# Patient Record
Sex: Female | Born: 1985 | Race: White | Hispanic: No | Marital: Married | Smoking: Never smoker
Health system: Southern US, Community
[De-identification: ages and names within clinical notes are randomized; demographics above are authoritative.]

## PROBLEM LIST (undated history)

## (undated) DIAGNOSIS — F419 Anxiety disorder, unspecified: Secondary | ICD-10-CM

## (undated) DIAGNOSIS — I499 Cardiac arrhythmia, unspecified: Secondary | ICD-10-CM

## (undated) DIAGNOSIS — R519 Headache, unspecified: Secondary | ICD-10-CM

## (undated) DIAGNOSIS — Z8739 Personal history of other diseases of the musculoskeletal system and connective tissue: Secondary | ICD-10-CM

## (undated) DIAGNOSIS — N189 Chronic kidney disease, unspecified: Secondary | ICD-10-CM

## (undated) DIAGNOSIS — M4646 Discitis, unspecified, lumbar region: Secondary | ICD-10-CM

## (undated) DIAGNOSIS — F32A Depression, unspecified: Secondary | ICD-10-CM

## (undated) DIAGNOSIS — N200 Calculus of kidney: Secondary | ICD-10-CM

## (undated) DIAGNOSIS — G8929 Other chronic pain: Secondary | ICD-10-CM

## (undated) DIAGNOSIS — R002 Palpitations: Secondary | ICD-10-CM

## (undated) DIAGNOSIS — I493 Ventricular premature depolarization: Secondary | ICD-10-CM

## (undated) DIAGNOSIS — T8853XA Unintended awareness under general anesthesia during procedure, initial encounter: Secondary | ICD-10-CM

## (undated) DIAGNOSIS — M4200 Juvenile osteochondrosis of spine, site unspecified: Secondary | ICD-10-CM

## (undated) DIAGNOSIS — M1990 Unspecified osteoarthritis, unspecified site: Secondary | ICD-10-CM

## (undated) DIAGNOSIS — E669 Obesity, unspecified: Secondary | ICD-10-CM

## (undated) HISTORY — PX: EXPLORATORY LAPAROTOMY: SUR591

## (undated) HISTORY — PX: LAMINECTOMY: SHX219

## (undated) HISTORY — PX: OTHER SURGICAL HISTORY: SHX169

## (undated) HISTORY — PX: POSTERIOR LAMINECTOMY / DECOMPRESSION CERVICAL SPINE: SUR739

---
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
## 2012-09-28 HISTORY — PX: BARIATRIC SURGERY: SHX1103

## 2016-06-12 ENCOUNTER — Emergency Department
Admission: EM | Admit: 2016-06-12 | Discharge: 2016-06-13 | Disposition: A | Discharge status: Home / Self Care | Payer: Managed Care, Other (non HMO) | Attending: Emergency Medicine | Admitting: Emergency Medicine

## 2016-06-12 ENCOUNTER — Encounter: Payer: Self-pay | Admitting: Emergency Medicine

## 2016-06-12 DIAGNOSIS — R002 Palpitations: Secondary | ICD-10-CM | POA: Diagnosis not present

## 2016-06-12 HISTORY — DX: Ventricular premature depolarization: I49.3

## 2016-06-12 HISTORY — DX: Unspecified osteoarthritis, unspecified site: M19.90

## 2016-06-12 LAB — BASIC METABOLIC PANEL
ANION GAP: 6 (ref 5–15)
BUN: 12 mg/dL (ref 6–20)
CO2: 22 mmol/L (ref 22–32)
Calcium: 9.4 mg/dL (ref 8.9–10.3)
Chloride: 111 mmol/L (ref 101–111)
Creatinine, Ser: 0.81 mg/dL (ref 0.44–1.00)
Glucose, Bld: 96 mg/dL (ref 65–99)
POTASSIUM: 3.5 mmol/L (ref 3.5–5.1)
SODIUM: 139 mmol/L (ref 135–145)

## 2016-06-12 LAB — CBC
HEMATOCRIT: 38.6 % (ref 35.0–47.0)
HEMOGLOBIN: 13.1 g/dL (ref 12.0–16.0)
MCH: 28.8 pg (ref 26.0–34.0)
MCHC: 34 g/dL (ref 32.0–36.0)
MCV: 84.6 fL (ref 80.0–100.0)
Platelets: 247 10*3/uL (ref 150–440)
RBC: 4.56 MIL/uL (ref 3.80–5.20)
RDW: 12.9 % (ref 11.5–14.5)
WBC: 7 10*3/uL (ref 3.6–11.0)

## 2016-06-12 LAB — TROPONIN I: Troponin I: <0.03 ng/mL (ref <0.03)

## 2016-06-12 LAB — POCT PREGNANCY, URINE: PREG TEST UR: NEGATIVE

## 2016-06-12 MED ORDER — ONDANSETRON HCL 4 MG/2ML IJ SOLN
4.0000 mg | Freq: Once | INTRAMUSCULAR | Status: AC
  Administered 2016-06-12: 4 mg via INTRAVENOUS

## 2016-06-12 MED ORDER — ONDANSETRON HCL 4 MG/2ML IJ SOLN
INTRAMUSCULAR | Status: AC
  Filled 2016-06-12: qty 2

## 2016-06-12 NOTE — ED Notes (Signed)
Pt c/o palpitations/PVCs at approx 2030. Pt c/o chest pain described as tightness, without radiation, with accompanying symptoms of dizziness/nausea/lightheadedness/dizzness/SOB

## 2016-06-12 NOTE — ED Triage Notes (Signed)
Pt states "i feel like my heart is quivering." pt states symptoms started approx 1 hour pta, pt states "i haven't felt good all day." pt states she has felt nauseted, shob, "felt like I was drunk, lightheaded". Pt denies diaphoresis. Pt states " I couldn't find my words this afternoon.". Skin pale, warm and dry. Pt is shivering in triage, states 'i feel so cold."

## 2016-06-12 NOTE — ED Notes (Signed)
MD Goodman at bedside. 

## 2016-06-12 NOTE — ED Notes (Signed)
Reviewed d/c instructions and follow-up care with pt. Pt verbalized understanding 

## 2016-06-12 NOTE — ED Provider Notes (Signed)
[facility] Emergency Department Provider Note   ____________________________________________   I have reviewed the triage vital signs and the nursing notes.   HISTORY  Chief Complaint Palpitations   History limited by: Not Limited   HPI Denise Raymond is a 30 y.o. female who presents to the emergency department today because of concerns for palpitations and racing heart beat that occurred earlier today. She states she had a couple of episodes. Throughout the whole day she not been feeling right. She had felt somewhat intoxicated although has not drunk any alcohol. I have any excessive caffeine use and did not have a cup of coffee today. She has had problems with PVCs in the past and is on medication including a beta blocker for this. She did take this beta blocker prior to coming to the emergency department and since being here his not had any further sensation of racing heart. She denies any associated chest pain. No recent fevers or illness.   Past Medical History:  Diagnosis Date  . DJD (degenerative joint disease)   . PVC (premature ventricular contraction)     There are no active problems to display for this patient.   Past Surgical History:  Procedure Laterality Date  . lap band sugery      Prior to Admission medications   Not on File    Allergies Bentyl [dicyclomine hcl]; Nsaids; and Reglan [metoclopramide]  No family history on file.  Social History Social History  Substance Use Topics  . Smoking status: Never Smoker  . Smokeless tobacco: Never Used  . Alcohol use No    Review of Systems  Constitutional: Negative for fever. Cardiovascular: Negative for chest pain. Positive for palpitations. Respiratory: Negative for shortness of breath. Gastrointestinal: Negative for abdominal pain, vomiting and diarrhea. Genitourinary: Negative for dysuria. Musculoskeletal: Negative for back pain. Skin: Negative for rash. Neurological:  Negative for headaches, focal weakness or numbness.   10-point ROS otherwise negative.  ____________________________________________   PHYSICAL EXAM:  VITAL SIGNS: ED Triage Vitals  Enc Vitals Group     BP 06/12/16 2137 (!) 128/102     Pulse Rate 06/12/16 2137 75     Resp 06/12/16 2134 (!) 24     Temp 06/12/16 2137 97.8 F (36.6 C)     Temp Source 06/12/16 2137 Oral     SpO2 06/12/16 2137 100 %     Weight 06/12/16 2134 (!) 385 lb (174.6 kg)     Height 06/12/16 2134 5\' 10"  (1.778 m)     Head Circumference --      Peak Flow --      Pain Score 06/12/16 2134 3   Constitutional: Alert and oriented. Well appearing and in no distress. Eyes: Conjunctivae are normal. Normal extraocular movements. ENT   Head: Normocephalic and atraumatic.   Nose: No congestion/rhinnorhea.   Mouth/Throat: Mucous membranes are moist.   Neck: No stridor. Hematological/Lymphatic/Immunilogical: No cervical lymphadenopathy. Cardiovascular: Normal rate, regular rhythm.  No murmurs, rubs, or gallops. Respiratory: Normal respiratory effort without tachypnea nor retractions. Breath sounds are clear and equal bilaterally. No wheezes/rales/rhonchi. Gastrointestinal: Soft and nontender. No distention.  Genitourinary: Deferred Musculoskeletal: Normal range of motion in all extremities. No lower extremity edema. Neurologic:  Normal speech and language. No gross focal neurologic deficits are appreciated.  Skin:  Skin is warm, dry and intact. No rash noted. Psychiatric: Mood and affect are normal. Speech and behavior are normal. Patient exhibits appropriate insight and judgment.  ____________________________________________    LABS (pertinent positives/negatives)  Labs Reviewed  BASIC METABOLIC PANEL  CBC  TROPONIN I  POCT PREGNANCY, URINE     ____________________________________________   EKG  I, Phineas Semen, attending physician, personally viewed and interpreted this EKG  EKG  Time: 2135 Rate: 73 Rhythm: normal sinus rhythm with 1st degree av block Axis: normal Intervals: qtc 423, 1st degree av block QRS: narrow ST changes: no st elevation Impression: abnormal ekg   ____________________________________________    RADIOLOGY  None   ____________________________________________   PROCEDURES  Procedures  ____________________________________________   INITIAL IMPRESSION / ASSESSMENT AND PLAN / ED COURSE  Pertinent labs & imaging results that were available during my care of the patient were reviewed by me and considered in my medical decision making (see chart for details).  Patient presented to the emergency department today because of concerns for palpitations and racing heart. On exam patient appears well. No murmurs rubs or gallops appreciated. I did notice a occasional PVC on the monitor however no sustained runs. I did discuss with patient that blood work all looked good. Did encourage patient to follow up with primary care and potentially cardiology. Did recommend that that she discuss Holter monitor with her provider. ____________________________________________   FINAL CLINICAL IMPRESSION(S) / ED DIAGNOSES  Final diagnoses:  Heart palpitations     Note: This dictation was prepared with Dragon dictation. Any transcriptional errors that result from this process are unintentional    Phineas Semen, MD 06/13/16 0023

## 2016-06-12 NOTE — ED Notes (Signed)
Pt c/o nausea. MD Derrill KayGoodman informed. MD ordered IV zofran

## 2016-06-12 NOTE — Discharge Instructions (Signed)
Please seek medical attention for any high fevers, chest pain, shortness of breath, change in behavior, persistent vomiting, bloody stool or any other new or concerning symptoms.  

## 2016-11-04 ENCOUNTER — Emergency Department
Admission: EM | Admit: 2016-11-04 | Discharge: 2016-11-04 | Disposition: A | Discharge status: Home / Self Care | Payer: Managed Care, Other (non HMO) | Attending: Emergency Medicine | Admitting: Emergency Medicine

## 2016-11-04 ENCOUNTER — Encounter: Payer: Self-pay | Admitting: Emergency Medicine

## 2016-11-04 ENCOUNTER — Emergency Department: Payer: Managed Care, Other (non HMO)

## 2016-11-04 DIAGNOSIS — R109 Unspecified abdominal pain: Secondary | ICD-10-CM | POA: Insufficient documentation

## 2016-11-04 DIAGNOSIS — Z79899 Other long term (current) drug therapy: Secondary | ICD-10-CM | POA: Diagnosis not present

## 2016-11-04 DIAGNOSIS — R319 Hematuria, unspecified: Secondary | ICD-10-CM | POA: Insufficient documentation

## 2016-11-04 DIAGNOSIS — O26891 Other specified pregnancy related conditions, first trimester: Secondary | ICD-10-CM | POA: Diagnosis present

## 2016-11-04 DIAGNOSIS — Z3A12 12 weeks gestation of pregnancy: Secondary | ICD-10-CM | POA: Insufficient documentation

## 2016-11-04 LAB — CBC
HEMATOCRIT: 34.1 % — AB (ref 35.0–47.0)
HEMOGLOBIN: 12.2 g/dL (ref 12.0–16.0)
MCH: 29.8 pg (ref 26.0–34.0)
MCHC: 35.7 g/dL (ref 32.0–36.0)
MCV: 83.5 fL (ref 80.0–100.0)
Platelets: 247 10*3/uL (ref 150–440)
RBC: 4.08 MIL/uL (ref 3.80–5.20)
RDW: 13.2 % (ref 11.5–14.5)
WBC: 6.8 10*3/uL (ref 3.6–11.0)

## 2016-11-04 LAB — HEPATIC FUNCTION PANEL
ALK PHOS: 51 U/L (ref 38–126)
ALT: 12 U/L — ABNORMAL LOW (ref 14–54)
AST: 19 U/L (ref 15–41)
Albumin: 4.1 g/dL (ref 3.5–5.0)
BILIRUBIN TOTAL: 0.1 mg/dL — AB (ref 0.3–1.2)
Bilirubin, Direct: <0.1 mg/dL — ABNORMAL LOW (ref 0.1–0.5)
TOTAL PROTEIN: 7.6 g/dL (ref 6.5–8.1)

## 2016-11-04 LAB — URINALYSIS, COMPLETE (UACMP) WITH MICROSCOPIC
Bilirubin Urine: NEGATIVE
GLUCOSE, UA: NEGATIVE mg/dL
KETONES UR: NEGATIVE mg/dL
LEUKOCYTES UA: NEGATIVE
NITRITE: NEGATIVE
PH: 5 (ref 5.0–8.0)
Protein, ur: NEGATIVE mg/dL
Specific Gravity, Urine: 1.019 (ref 1.005–1.030)

## 2016-11-04 LAB — BASIC METABOLIC PANEL
ANION GAP: 8 (ref 5–15)
BUN: 10 mg/dL (ref 6–20)
CALCIUM: 9.5 mg/dL (ref 8.9–10.3)
CO2: 23 mmol/L (ref 22–32)
Chloride: 102 mmol/L (ref 101–111)
Creatinine, Ser: 0.6 mg/dL (ref 0.44–1.00)
GFR calc Af Amer: >60 mL/min (ref >60)
GFR calc non Af Amer: >60 mL/min (ref >60)
GLUCOSE: 93 mg/dL (ref 65–99)
Potassium: 3.5 mmol/L (ref 3.5–5.1)
Sodium: 133 mmol/L — ABNORMAL LOW (ref 135–145)

## 2016-11-04 MED ORDER — PROMETHAZINE HCL 25 MG/ML IJ SOLN
12.5000 mg | Freq: Once | INTRAMUSCULAR | Status: AC
  Administered 2016-11-04: 12.5 mg via INTRAVENOUS

## 2016-11-04 MED ORDER — PROMETHAZINE HCL 25 MG/ML IJ SOLN
INTRAMUSCULAR | Status: AC
  Administered 2016-11-04: 12.5 mg via INTRAVENOUS
  Filled 2016-11-04: qty 1

## 2016-11-04 MED ORDER — SODIUM CHLORIDE 0.9 % IV BOLUS (SEPSIS)
1000.0000 mL | Freq: Once | INTRAVENOUS | Status: AC
  Administered 2016-11-04: 1000 mL via INTRAVENOUS

## 2016-11-04 MED ORDER — OXYCODONE-ACETAMINOPHEN 5-325 MG PO TABS: 1.0000 | ORAL_TABLET | Freq: Four times a day (QID) | ORAL | 0 refills | Status: AC | PRN

## 2016-11-04 NOTE — ED Notes (Signed)
Dr. Malinda at bedside.  

## 2016-11-04 NOTE — ED Triage Notes (Addendum)
Pt presents to ED via POV. C/O sudden onset back pain that started on Sunday with vaginal cramping. Pt states [facility] and [facility] for possible miscarriage, denies any vaginal cramping/bleeding at this time. Pt states [facility] and sent home on Keflex. Pt presents today with c/o nausea, "feeling horrible", back pain. Pt states at this time back pain is 10/10 intermittent.

## 2016-11-04 NOTE — Discharge Instructions (Signed)
I think you're having a kidney stone. You have blood in your urine. I will give you a prescription for Percocet one pill 4 times a day as needed for the pain. Please return for worse pain fever vomiting feeling sicker or for any other complaints. Please follow-up with your OB/GYN doctor this week. Drink plenty of fluids

## 2016-11-04 NOTE — ED Provider Notes (Signed)
[facility] Emergency Department Provider Note   ____________________________________________   First MD Initiated Contact with Patient 11/04/16 [phone]     (approximate)  I have reviewed the triage vital signs and the nursing notes.   HISTORY  Chief Complaint Flank Pain and Back Pain    HPI Denise Raymond is a 31 y.o. female with her first pregnancy. She is 12 weeks tomorrow. She reports 4 days ago she had sudden onset of back pain and vaginal cramping. [facility] evaluated for possible miscarriage diagnosed with UTI. Patient comes back now complaining of feeling bad and having bilateral back pain. It's worse on the left. She is nauseated and has not been running a fever is not vomiting   Past Medical History:  Diagnosis Date  . DJD (degenerative joint disease)   . PVC (premature ventricular contraction)     There are no active problems to display for this patient.   Past Surgical History:  Procedure Laterality Date  . lap band sugery      Prior to Admission medications   Medication Sig Start Date End Date Taking? Authorizing Provider  Cholecalciferol (HM VITAMIN D3) 4000 units CAPS Take 4,000 Units by mouth daily.   Yes Historical Provider, MD  folic acid (FOLVITE) 400 MCG tablet Take 400 mcg by mouth daily.   Yes Historical Provider, MD  prenatal vitamin w/FE, FA (NATACHEW) 29-1 MG CHEW chewable tablet Chew 1 tablet by mouth daily at 12 noon.   Yes Historical Provider, MD  oxyCODONE-acetaminophen (ROXICET) 5-325 MG tablet Take 1 tablet by mouth every 6 (six) hours as needed. 11/04/16 11/04/17  Arnaldo NatalPaul F Malinda, MD    Allergies Bentyl [dicyclomine hcl]; Nsaids; and Reglan [metoclopramide]  History reviewed. No pertinent family history.  Social History Social History  Substance Use Topics  . Smoking status: Never Smoker  . Smokeless tobacco: Never Used  . Alcohol use No    Review of Systems Constitutional: No  fever/chills Eyes: No visual changes. ENT: No sore throat. Cardiovascular: Denies chest pain. Respiratory: Denies shortness of breath. Gastrointestinal: No abdominal pain.  No nausea, no vomiting.  No diarrhea.  No constipation. Genitourinary: Negative for dysuria. Musculoskeletal:  back pain. Skin: Negative for rash. Neurological: Negative for headaches, focal weakness or numbness.  10-point ROS otherwise negative.  ____________________________________________   PHYSICAL EXAM:  VITAL SIGNS: ED Triage Vitals  Enc Vitals Group     BP 11/04/16 0920 113/73     Pulse Rate 11/04/16 0920 75     Resp --      Temp 11/04/16 0920 97.8 F (36.6 C)     Temp Source 11/04/16 0920 Oral     SpO2 11/04/16 0920 100 %     Weight --      Height --      Head Circumference --      Peak Flow --      Pain Score 11/04/16 0904 6     Pain Loc --      Pain Edu? --      Excl. in GC? --     Constitutional: Alert and oriented. Well appearing and in no acute distress. Eyes: Conjunctivae are normal. PERRL. EOMI. Head: Atraumatic. Nose: No congestion/rhinnorhea. Mouth/Throat: Mucous membranes are moist.  Oropharynx non-erythematous. Neck: No stridor.  Cardiovascular: Normal rate, regular rhythm. Grossly normal heart sounds.  Good peripheral circulation. Respiratory: Normal respiratory effort.  No retractions. Lungs CTAB. Gastrointestinal: Soft and nontender. No distention. No abdominal  bruits. No CVA tenderness. Musculoskeletal: No lower extremity tenderness nor edema.  No joint effusions. Neurologic:  Normal speech and language. No gross focal neurologic deficits are appreciated. No gait instability. Skin:  Skin is warm, dry and intact. No rash noted. Psychiatric: Mood and affect are normal. Speech and behavior are normal.  ____________________________________________   LABS (all labs ordered are listed, but only abnormal results are displayed)  Labs Reviewed  URINALYSIS, COMPLETE (UACMP)  WITH MICROSCOPIC - Abnormal; Notable for the following:       Result Value   Color, Urine YELLOW (*)    APPearance CLEAR (*)    Hgb urine dipstick MODERATE (*)    Bacteria, UA RARE (*)    Squamous Epithelial / LPF 0-5 (*)    All other components within normal limits  BASIC METABOLIC PANEL - Abnormal; Notable for the following:    Sodium 133 (*)    All other components within normal limits  CBC - Abnormal; Notable for the following:    HCT 34.1 (*)    All other components within normal limits  HEPATIC FUNCTION PANEL - Abnormal; Notable for the following:    ALT 12 (*)    Total Bilirubin 0.1 (*)    Bilirubin, Direct <0.1 (*)    All other components within normal limits  URINE CULTURE   ____________________________________________  EKG   ____________________________________________  RADIOLOGY  Study Result   CLINICAL DATA:  Left-greater-than-right flank pain for 4 days. The patient is [redacted] weeks pregnant, current urinary tract infection  EXAM: RENAL / URINARY TRACT ULTRASOUND COMPLETE  COMPARISON:  None.  FINDINGS: Right Kidney:  Length: 13.6 cm.  No hydronephrosis is seen.  Left Kidney:  Length: 13.2 cm. No hydronephrosis is noted. The lower pole is not well seen due to bowel gas.  Bladder:  The urinary bladder is not well distended but no abnormality is noted.  IMPRESSION: No hydronephrosis.  No renal calculi are evident.   Electronically Signed   By: Dwyane Dee M.D.   On: 11/04/2016 10:16     ____________________________________________   PROCEDURES  Procedure(s) performed:   Procedures  Critical Care performed ____________________________________________   INITIAL IMPRESSION / ASSESSMENT AND PLAN / ED COURSE  Pertinent labs & imaging results that were available during my care of the patient were reviewed by me and considered in my medical decision making (see chart for details).         ____________________________________________   FINAL CLINICAL IMPRESSION(S) / ED DIAGNOSES  Final diagnoses:  Flank pain  Hematuria, unspecified type      NEW MEDICATIONS STARTED DURING THIS VISIT:  Discharge Medication List as of 11/04/2016 12:37 PM    START taking these medications   Details  oxyCODONE-acetaminophen (ROXICET) 5-325 MG tablet Take 1 tablet by mouth every 6 (six) hours as needed., Starting Wed 11/04/2016, Until Thu 11/04/2017, Print         Note:  This document was prepared using Dragon voice recognition software and may include unintentional dictation errors.    Arnaldo Natal, MD 11/04/16 [phone]

## 2016-11-05 LAB — URINE CULTURE

## 2018-02-23 ENCOUNTER — Emergency Department: Payer: 59

## 2018-02-23 ENCOUNTER — Emergency Department
Admission: EM | Admit: 2018-02-23 | Discharge: 2018-02-23 | Disposition: A | Discharge status: Home / Self Care | Payer: 59 | Attending: Emergency Medicine | Admitting: Emergency Medicine

## 2018-02-23 ENCOUNTER — Other Ambulatory Visit: Payer: Self-pay

## 2018-02-23 ENCOUNTER — Encounter: Payer: Self-pay | Admitting: Emergency Medicine

## 2018-02-23 DIAGNOSIS — R110 Nausea: Secondary | ICD-10-CM | POA: Insufficient documentation

## 2018-02-23 DIAGNOSIS — R109 Unspecified abdominal pain: Secondary | ICD-10-CM | POA: Diagnosis present

## 2018-02-23 DIAGNOSIS — Z9884 Bariatric surgery status: Secondary | ICD-10-CM | POA: Diagnosis not present

## 2018-02-23 DIAGNOSIS — Z79899 Other long term (current) drug therapy: Secondary | ICD-10-CM | POA: Insufficient documentation

## 2018-02-23 DIAGNOSIS — B379 Candidiasis, unspecified: Secondary | ICD-10-CM

## 2018-02-23 DIAGNOSIS — R1084 Generalized abdominal pain: Secondary | ICD-10-CM

## 2018-02-23 LAB — URINALYSIS, COMPLETE (UACMP) WITH MICROSCOPIC
BILIRUBIN URINE: NEGATIVE
Bacteria, UA: NONE SEEN
Glucose, UA: NEGATIVE mg/dL
Ketones, ur: NEGATIVE mg/dL
NITRITE: NEGATIVE
PROTEIN: NEGATIVE mg/dL
Specific Gravity, Urine: 1.024 (ref 1.005–1.030)
pH: 5 (ref 5.0–8.0)

## 2018-02-23 LAB — COMPREHENSIVE METABOLIC PANEL
ALK PHOS: 86 U/L (ref 38–126)
ALT: 12 U/L — ABNORMAL LOW (ref 14–54)
ANION GAP: 5 (ref 5–15)
AST: 17 U/L (ref 15–41)
Albumin: 3.6 g/dL (ref 3.5–5.0)
BILIRUBIN TOTAL: 0.4 mg/dL (ref 0.3–1.2)
BUN: 14 mg/dL (ref 6–20)
CALCIUM: 9 mg/dL (ref 8.9–10.3)
CO2: 27 mmol/L (ref 22–32)
Chloride: 103 mmol/L (ref 101–111)
Creatinine, Ser: 0.76 mg/dL (ref 0.44–1.00)
GFR calc non Af Amer: >60 mL/min (ref >60)
Glucose, Bld: 94 mg/dL (ref 65–99)
Potassium: 4.1 mmol/L (ref 3.5–5.1)
Sodium: 135 mmol/L (ref 135–145)
TOTAL PROTEIN: 7.8 g/dL (ref 6.5–8.1)

## 2018-02-23 LAB — CBC
HCT: 38.9 % (ref 35.0–47.0)
HEMOGLOBIN: 13 g/dL (ref 12.0–16.0)
MCH: 27.2 pg (ref 26.0–34.0)
MCHC: 33.4 g/dL (ref 32.0–36.0)
MCV: 81.6 fL (ref 80.0–100.0)
Platelets: 301 10*3/uL (ref 150–440)
RBC: 4.76 MIL/uL (ref 3.80–5.20)
RDW: 13.9 % (ref 11.5–14.5)
WBC: 6.3 10*3/uL (ref 3.6–11.0)

## 2018-02-23 LAB — POCT PREGNANCY, URINE: Preg Test, Ur: NEGATIVE

## 2018-02-23 LAB — LIPASE, BLOOD: Lipase: 25 U/L (ref 11–51)

## 2018-02-23 MED ORDER — SODIUM CHLORIDE 0.9 % IV BOLUS
1000.0000 mL | Freq: Once | INTRAVENOUS | Status: AC
  Administered 2018-02-23: 1000 mL via INTRAVENOUS

## 2018-02-23 MED ORDER — ONDANSETRON 4 MG PO TBDP: 4.0000 mg | ORAL_TABLET | Freq: Three times a day (TID) | ORAL | 0 refills | Status: DC | PRN

## 2018-02-23 MED ORDER — NYSTATIN 100000 UNIT/GM EX POWD: Freq: Four times a day (QID) | CUTANEOUS | 0 refills | Status: DC

## 2018-02-23 MED ORDER — ONDANSETRON HCL 4 MG/2ML IJ SOLN
4.0000 mg | Freq: Once | INTRAMUSCULAR | Status: AC
  Administered 2018-02-23: 4 mg via INTRAVENOUS
  Filled 2018-02-23: qty 2

## 2018-02-23 MED ORDER — IOPAMIDOL (ISOVUE-370) INJECTION 76%
100.0000 mL | Freq: Once | INTRAVENOUS | Status: AC | PRN
Start: 2018-02-23 — End: 2018-02-23
  Administered 2018-02-23: 100 mL via INTRAVENOUS

## 2018-02-23 NOTE — Discharge Instructions (Signed)
Your labs and CT scan of the abdomen were unremarkable today. Please follow up with your doctor for continued monitoring of your symptoms.

## 2018-02-23 NOTE — ED Notes (Addendum)
Pt arrived with c/o abdominal pain in the lower right umbilicus that radiates to the left since Monday. Pt states that she has had Nausea and took some zofran from pregnancy that helped ease nausea.

## 2018-02-23 NOTE — ED Triage Notes (Signed)
Pt c/o lower abdominal pain since Monday states the pain radiates down to the right groin.  Pt describes the pain as sharp 5/10.  Pt states the pain is worse with palpation.    Pt c/o nausea, pt states she cannot vomit due to having a gastric band.   Pt has had cholecystectomy.

## 2018-02-23 NOTE — ED Notes (Signed)
First Nurse Note:  Patient to ED via WC from [facility] with hx of peri-umbilical pain since Monday radiating to RLQ, patient does have her appendix.

## 2018-02-23 NOTE — ED Provider Notes (Addendum)
[facility] Emergency Department Provider Note  ____________________________________________  Time seen: Approximately 2:36 PM  I have reviewed the triage vital signs and the nursing notes.   HISTORY  Chief Complaint Abdominal Pain    HPI Denise Raymond is a 32 y.o. female with a history of bariatric lap band surgery and DJD who complains of generalized abdominal pain around the umbilicus stands yesterday. Constant, worse lying supine, no alleviating factors, nonradiating. Moderate intensity, aching. Associated nausea but no vomiting diarrhea or constipation. Last bowel movement was yesterday.      Past Medical History:  Diagnosis Date  . DJD (degenerative joint disease)   . PVC (premature ventricular contraction)      There are no active problems to display for this patient.    Past Surgical History:  Procedure Laterality Date  . lap band sugery       Prior to Admission medications   Medication Sig Start Date End Date Taking? Authorizing Provider  baclofen (LIORESAL) 10 MG tablet Take 10 mg by mouth daily. 02/17/18  Yes [provider]  ESTARYLLA 0.25-35 MG-MCG tablet Take 1 tablet by mouth as directed. 12/29/17  Yes [provider]  metoprolol succinate (TOPROL-XL) 25 MG 24 hr tablet Take 1 tablet by mouth daily. 02/09/18  Yes [provider]  nortriptyline (PAMELOR) 10 MG capsule Take 30 mg by mouth every evening. 02/21/18  Yes [provider]  rizatriptan (MAXALT-MLT) 10 MG disintegrating tablet Take 1-2 tablets by mouth daily as needed. 01/26/18  Yes [provider]  sertraline (ZOLOFT) 100 MG tablet Take 100 mg by mouth daily. 12/07/17  Yes [provider]  ondansetron (ZOFRAN ODT) 4 MG disintegrating tablet Take 1 tablet (4 mg total) by mouth every 8 (eight) hours as needed for nausea or vomiting. 02/23/18   Sharman Cheek, MD     Allergies Bentyl [dicyclomine hcl]; Latex; Nsaids; and  Reglan [metoclopramide]   No family history on file.  Social History Social History   Tobacco Use  . Smoking status: Never Smoker  . Smokeless tobacco: Never Used  Substance Use Topics  . Alcohol use: No  . Drug use: No    Review of Systems  Constitutional:   No fever or chills.  Cardiovascular:   No chest pain or syncope. Respiratory:   No dyspnea or cough. Gastrointestinal:  positive for abdominal pain without vomiting and diarrhea.  Musculoskeletal:   Negative for focal pain or swelling All other systems reviewed and are negative except as documented above in ROS and HPI.  ____________________________________________   PHYSICAL EXAM:  VITAL SIGNS: ED Triage Vitals  Enc Vitals Group     BP 02/23/18 0959 129/80     Pulse Rate 02/23/18 0959 81     Resp 02/23/18 0959 18     Temp 02/23/18 0959 98.3 F (36.8 C)     Temp Source 02/23/18 0959 Oral     SpO2 02/23/18 0959 100 %     Weight 02/23/18 1000 (!) 400 lb (181.4 kg)     Height 02/23/18 1000  (1.778 m)     Head Circumference --      Peak Flow --      Pain Score 02/23/18 0959 5     Pain Loc --      Pain Edu? --      Excl. in GC? --     Vital signs reviewed, nursing assessments reviewed.   Constitutional:   Alert and oriented. Well appearing and in no  distress. Eyes:   Conjunctivae are normal. EOMI. PERRL. ENT      Head:   Normocephalic and atraumatic.      Nose:   No congestion/rhinnorhea.       Mouth/Throat:   MMM, no pharyngeal erythema. No peritonsillar mass.       Neck:   No meningismus. Full ROM. Hematological/Lymphatic/Immunilogical:   No cervical lymphadenopathy. Cardiovascular:   RRR. Symmetric bilateral radial and DP pulses.  No murmurs.  Respiratory:   Normal respiratory effort without tachypnea/retractions. Breath sounds are clear and equal bilaterally. No wheezes/rales/rhonchi. Gastrointestinal:   Soft with generalized tenderness, worse in the right lower quadrant. Non distended. There  is no CVA tenderness.  No rebound, rigidity, or guarding.  Musculoskeletal:   Normal range of motion in all extremities. No joint effusions.  No lower extremity tenderness.  No edema. Neurologic:   Normal speech and language.  Motor grossly intact. No acute focal neurologic deficits are appreciated.  Skin:    Skin is warm, dry and intact. There is candidiasis around the umbilicus ____________________________________________    LABS (pertinent positives/negatives) (all labs ordered are listed, but only abnormal results are displayed) Labs Reviewed  COMPREHENSIVE METABOLIC PANEL - Abnormal; Notable for the following components:      Result Value   ALT 12 (*)    All other components within normal limits  URINALYSIS, COMPLETE (UACMP) WITH MICROSCOPIC - Abnormal; Notable for the following components:   Color, Urine YELLOW (*)    APPearance HAZY (*)    Hgb urine dipstick MODERATE (*)    Leukocytes, UA TRACE (*)    All other components within normal limits  LIPASE, BLOOD  CBC  POC URINE PREG, ED  POCT PREGNANCY, URINE   ____________________________________________   EKG Interpreted by me Sinus rhythm rate of 80, right axis, first-degree AV block with PR interval 244 ms. Normal QRS ST segments and T waves.   ____________________________________________    RADIOLOGY  Ct Abdomen Pelvis W Contrast  Result Date: 02/23/2018 CLINICAL DATA:  Lower abdominal pain for 3 days EXAM: CT ABDOMEN AND PELVIS WITH CONTRAST TECHNIQUE: Multidetector CT imaging of the abdomen and pelvis was performed using the standard protocol following bolus administration of intravenous contrast. CONTRAST:  ISOVUE-370 IOPAMIDOL (ISOVUE-370) INJECTION 76% COMPARISON:  None. FINDINGS: Lower chest: No acute abnormality. Hepatobiliary: No focal liver abnormality is seen. Status post cholecystectomy. No biliary dilatation. Pancreas: Unremarkable. No pancreatic ductal dilatation or surrounding inflammatory  changes. Spleen: Normal in size without focal abnormality. Adrenals/Urinary Tract: Adrenal glands are within normal limits. Kidneys are well visualized bilaterally with a normal enhancement pattern. No renal calculi or obstructive changes are noted. The bladder is partially distended. Stomach/Bowel: Gastric lap band is seen. No obstructive or inflammatory changes of the bowel are noted. The appendix is within normal limits. Vascular/Lymphatic: No significant vascular findings are present. No enlarged abdominal or pelvic lymph nodes. Reproductive: Uterus and bilateral adnexa are unremarkable. Other: No abdominal wall hernia or abnormality. No abdominopelvic ascites. Musculoskeletal: No acute or significant osseous findings. IMPRESSION: No acute abnormality is noted. Electronically Signed   By: Alcide Clever M.D.   On: 02/23/2018 13:46    ____________________________________________   PROCEDURES Procedures  ____________________________________________  DIFFERENTIAL DIAGNOSIS   appendicitis, bowel obstruction, bowel perforation, diverticulitis  CLINICAL IMPRESSION / ASSESSMENT AND PLAN / ED COURSE  Pertinent labs & imaging results that were available during my care of the patient were reviewed by me and considered in my medical decision making (see chart for  details).      Clinical Course as of Feb 24 1435  Wed Feb 23, 2018  1301 P/w gen abd pain, rlq ttp. Will get CT a/p. IVF, IV zofran for nausea. Pt declines pain meds at this time.    [PS]    Clinical Course User Index [PS] Sharman Cheek, MD     ----------------------------------------- 2:42 PM on 02/23/2018 -----------------------------------------  Labs unremarkable, CT normal. Suitable for discharge home, continue Zofran as needed. Patient comfortable with current pain level and declines analgesics. Follow up with primary care.  ____________________________________________   FINAL CLINICAL IMPRESSION(S) / ED  DIAGNOSES    Final diagnoses:  Generalized abdominal pain  Nausea     ED Discharge Orders        Ordered    ondansetron (ZOFRAN ODT) 4 MG disintegrating tablet  Every 8 hours PRN     02/23/18 1435      Portions of this note were generated with dragon dictation software. Dictation errors may occur despite best attempts at proofreading.    Sharman Cheek, MD 02/23/18 1443    Sharman Cheek, MD 03/02/18 1525

## 2018-09-28 HISTORY — PX: MICRODISCECTOMY LUMBAR: SUR864

## 2018-11-05 ENCOUNTER — Other Ambulatory Visit: Payer: Self-pay

## 2018-11-05 ENCOUNTER — Emergency Department
Admission: EM | Admit: 2018-11-05 | Discharge: 2018-11-05 | Disposition: A | Discharge status: Home / Self Care | Payer: 59 | Attending: Emergency Medicine | Admitting: Emergency Medicine

## 2018-11-05 ENCOUNTER — Emergency Department: Payer: 59

## 2018-11-05 ENCOUNTER — Encounter: Payer: Self-pay | Admitting: Emergency Medicine

## 2018-11-05 DIAGNOSIS — Z91040 Latex allergy status: Secondary | ICD-10-CM | POA: Insufficient documentation

## 2018-11-05 DIAGNOSIS — Y9389 Activity, other specified: Secondary | ICD-10-CM | POA: Insufficient documentation

## 2018-11-05 DIAGNOSIS — Y999 Unspecified external cause status: Secondary | ICD-10-CM | POA: Insufficient documentation

## 2018-11-05 DIAGNOSIS — Z79899 Other long term (current) drug therapy: Secondary | ICD-10-CM | POA: Insufficient documentation

## 2018-11-05 DIAGNOSIS — S93401A Sprain of unspecified ligament of right ankle, initial encounter: Secondary | ICD-10-CM | POA: Diagnosis not present

## 2018-11-05 DIAGNOSIS — T07XXXA Unspecified multiple injuries, initial encounter: Secondary | ICD-10-CM | POA: Diagnosis not present

## 2018-11-05 DIAGNOSIS — S99911A Unspecified injury of right ankle, initial encounter: Secondary | ICD-10-CM | POA: Diagnosis present

## 2018-11-05 DIAGNOSIS — G44319 Acute post-traumatic headache, not intractable: Secondary | ICD-10-CM

## 2018-11-05 DIAGNOSIS — W108XXA Fall (on) (from) other stairs and steps, initial encounter: Secondary | ICD-10-CM | POA: Insufficient documentation

## 2018-11-05 DIAGNOSIS — W19XXXA Unspecified fall, initial encounter: Secondary | ICD-10-CM

## 2018-11-05 DIAGNOSIS — Y92009 Unspecified place in unspecified non-institutional (private) residence as the place of occurrence of the external cause: Secondary | ICD-10-CM | POA: Diagnosis not present

## 2018-11-05 MED ORDER — HYDROCODONE-ACETAMINOPHEN 5-325 MG PO TABS: 1.0000 | ORAL_TABLET | Freq: Four times a day (QID) | ORAL | 0 refills | Status: DC | PRN

## 2018-11-05 MED ORDER — HYDROCODONE-ACETAMINOPHEN 5-325 MG PO TABS: 1.0000 | ORAL_TABLET | ORAL | 0 refills | Status: DC | PRN

## 2018-11-05 MED ORDER — ONDANSETRON 4 MG PO TBDP
4.0000 mg | ORAL_TABLET | Freq: Once | ORAL | Status: AC
Start: 2018-11-05 — End: 2018-11-05
  Administered 2018-11-05: 4 mg via ORAL
  Filled 2018-11-05: qty 1

## 2018-11-05 MED ORDER — HYDROCODONE-ACETAMINOPHEN 5-325 MG PO TABS
1.0000 | ORAL_TABLET | Freq: Once | ORAL | Status: AC
  Administered 2018-11-05: 1 via ORAL
  Filled 2018-11-05: qty 1

## 2018-11-05 NOTE — ED Notes (Signed)
Called CT and  States as long as patient states she isn't pregnant they will shield her

## 2018-11-05 NOTE — ED Provider Notes (Signed)
[facility] Emergency Department Provider Note  ____________________________________________   First MD Initiated Contact with Patient 11/05/18 0820     (approximate)  I have reviewed the triage vital signs and the nursing notes.   HISTORY  Chief Complaint Ankle Pain and Shoulder Pain   HPI Denise Raymond is a 33 y.o. female presents to the ED with multiple complaints after she fell going down the steps this morning on her way to work.  Patient states that she was inside the house and tripped on carpeted steps falling down several steps hitting her head against the wall at the bottom.  She denies any loss of consciousness or visual changes but complains of a headache at this time.  She also reports some nausea but no vomiting.  Currently she complains of right ankle pain and is unable to bear weight.  She also has left shoulder pain but continues to move her shoulder without any restriction.  She rates her pain as an 8 out of 10.   Past Medical History:  Diagnosis Date  . DJD (degenerative joint disease)   . PVC (premature ventricular contraction)     There are no active problems to display for this patient.   Past Surgical History:  Procedure Laterality Date  . lap band sugery      Prior to Admission medications   Medication Sig Start Date End Date Taking? Authorizing Provider  baclofen (LIORESAL) 10 MG tablet Take 10 mg by mouth daily. 02/17/18   [provider]  ESTARYLLA 0.25-35 MG-MCG tablet Take 1 tablet by mouth as directed. 12/29/17   [provider]  HYDROcodone-acetaminophen (NORCO/VICODIN) 5-325 MG tablet Take 1 tablet by mouth every 6 (six) hours as needed for moderate pain. 11/05/18   Tommi Rumps, PA-C  metoprolol succinate (TOPROL-XL) 25 MG 24 hr tablet Take 1 tablet by mouth daily. 02/09/18   [provider]  nortriptyline (PAMELOR) 10 MG capsule Take 30 mg by mouth every evening. 02/21/18   [provider]  nystatin (MYCOSTATIN/NYSTOP) powder Apply topically 4 (four) times daily. 02/23/18   Sharman Cheek, MD  ondansetron (ZOFRAN ODT) 4 MG disintegrating tablet Take 1 tablet (4 mg total) by mouth every 8 (eight) hours as needed for nausea or vomiting. 02/23/18   Sharman Cheek, MD  rizatriptan (MAXALT-MLT) 10 MG disintegrating tablet Take 1-2 tablets by mouth daily as needed. 01/26/18   [provider]  sertraline (ZOLOFT) 100 MG tablet Take 100 mg by mouth daily. 12/07/17   [provider]    Allergies Bentyl [dicyclomine hcl]; Latex; Nsaids; and Reglan [metoclopramide]  No family history on file.  Social History Social History   Tobacco Use  . Smoking status: Never Smoker  . Smokeless tobacco: Never Used  Substance Use Topics  . Alcohol use: No  . Drug use: No    Review of Systems Constitutional: No fever/chills Eyes: No visual changes. ENT: No trauma. Cardiovascular: Denies chest pain. Respiratory: Denies shortness of breath. Gastrointestinal: No abdominal pain.  Positive nausea, no vomiting.  No diarrhea.   Musculoskeletal: Positive for right ankle pain.  Positive for left shoulder pain. Skin: Negative for laceration or abrasion. Neurological: Positive for headaches, no focal weakness or numbness. ___________________________________________   PHYSICAL EXAM:  VITAL SIGNS: ED Triage Vitals  Enc Vitals Group     BP 11/05/18 0734 (!) 126/45     Pulse Rate 11/05/18 0734 77     Resp 11/05/18 0734 18     Temp  11/05/18 0734 (!) 97.4 F (36.3 C)     Temp Source 11/05/18 0734 Oral     SpO2 11/05/18 0734 100 %     Weight 11/05/18 0735 (!) 448 lb (203.2 kg)     Height 11/05/18 0735 5\' 10"  (1.778 m)     Head Circumference --      Peak Flow --      Pain Score 11/05/18 0735 8     Pain Loc --      Pain Edu? --      Excl. in [place]? --    Constitutional: Alert and oriented. Well appearing and in no acute distress.  Patient answers questions  appropriately and is sitting in wheelchair with right ankle elevated. Eyes: Conjunctivae are normal. PERRL. EOMI. Head: Atraumatic. Nose: No trauma. Mouth: No trauma. Neck: No stridor.  Nontender cervical spine to palpation posteriorly. Cardiovascular: Normal rate, regular rhythm. Grossly normal heart sounds.  Good peripheral circulation. Respiratory: Normal respiratory effort.  No retractions. Lungs CTAB. Gastrointestinal: Soft and nontender. No distention.  No CVA tenderness. Musculoskeletal: Examination of the right ankle there is moderate soft tissue swelling along with tenderness to palpation especially on the lateral aspect.  Range of motion is restricted secondary to discomfort and increased pain.  Patient is able to move digits without any difficulty and motor sensory function intact.  No ecchymosis or abrasions were seen.  Pulses present.  No tenderness on palpation of knees bilaterally.  On examination of left shoulder there is no gross deformity and range of motion is without crepitus or restriction.  Nontender thoracic and lumbar spine.  Gait was not tested secondary to patient's ankle pain. Neurologic:  Normal speech and language. No gross focal neurologic deficits are appreciated. Skin:  Skin is warm, dry. No lacerations, abrasions or ecchymosis present. Psychiatric: Mood and affect are normal. Speech and behavior are normal.  ____________________________________________   LABS (all labs ordered are listed, but only abnormal results are displayed)  Labs Reviewed - No data to display  RADIOLOGY   Official radiology report(s): Dg Ankle Complete Right  Result Date: 11/05/2018 CLINICAL DATA:  Larey Seat and injured right ankle. EXAM: RIGHT ANKLE - COMPLETE 3+ VIEW COMPARISON:  None. FINDINGS: The ankle mortise is maintained. No acute ankle fracture is identified. No osteochondral lesion. No definite joint effusion. The mid and hindfoot bony structures are intact. IMPRESSION: No acute  ankle fracture. Electronically Signed   By: Rudie Meyer M.D.   On: 11/05/2018 08:32   Ct Head Wo Contrast  Result Date: 11/05/2018 CLINICAL DATA:  33 year old female status post fall down the stairs earlier this morning. Head and neck pain. EXAM: CT HEAD WITHOUT CONTRAST CT CERVICAL SPINE WITHOUT CONTRAST TECHNIQUE: Multidetector CT imaging of the head and cervical spine was performed following the standard protocol without intravenous contrast. Multiplanar CT image reconstructions of the cervical spine were also generated. COMPARISON:  None. FINDINGS: CT HEAD FINDINGS Brain: No evidence of acute infarction, hemorrhage, hydrocephalus, extra-axial collection or mass lesion/mass effect. Vascular: No hyperdense vessel or unexpected calcification. Skull: Normal. Negative for fracture or focal lesion. Sinuses/Orbits: No acute finding. Other: None. CT CERVICAL SPINE FINDINGS Alignment: Reversal of the normal cervical lordosis. Skull base and vertebrae: No acute fracture. No primary bone lesion or focal pathologic process. Soft tissues and spinal canal: No prevertebral fluid or swelling. No visible canal hematoma. Disc levels:  No level specific disease. Upper chest: Negative. Other: None IMPRESSION: CT HEAD 1. Normal head CT. CT CSPINE 1. Reversal of the  normal cervical lordosis may be positional or related to muscle spasm. 2. No evidence of acute fracture or malalignment. Electronically Signed   By: Malachy MoanHeath  McCullough M.D.   On: 11/05/2018 09:58   Ct Cervical Spine Wo Contrast  Result Date: 11/05/2018 CLINICAL DATA:  33 year old female status post fall down the stairs earlier this morning. Head and neck pain. EXAM: CT HEAD WITHOUT CONTRAST CT CERVICAL SPINE WITHOUT CONTRAST TECHNIQUE: Multidetector CT imaging of the head and cervical spine was performed following the standard protocol without intravenous contrast. Multiplanar CT image reconstructions of the cervical spine were also generated. COMPARISON:  None.  FINDINGS: CT HEAD FINDINGS Brain: No evidence of acute infarction, hemorrhage, hydrocephalus, extra-axial collection or mass lesion/mass effect. Vascular: No hyperdense vessel or unexpected calcification. Skull: Normal. Negative for fracture or focal lesion. Sinuses/Orbits: No acute finding. Other: None. CT CERVICAL SPINE FINDINGS Alignment: Reversal of the normal cervical lordosis. Skull base and vertebrae: No acute fracture. No primary bone lesion or focal pathologic process. Soft tissues and spinal canal: No prevertebral fluid or swelling. No visible canal hematoma. Disc levels:  No level specific disease. Upper chest: Negative. Other: None IMPRESSION: CT HEAD 1. Normal head CT. CT CSPINE 1. Reversal of the normal cervical lordosis may be positional or related to muscle spasm. 2. No evidence of acute fracture or malalignment. Electronically Signed   By: Malachy MoanHeath  McCullough M.D.   On: 11/05/2018 09:58    ____________________________________________   PROCEDURES  Procedure(s) performed: None  Procedures  Critical Care performed: No  ____________________________________________   INITIAL IMPRESSION / ASSESSMENT AND PLAN / ED COURSE  As part of my medical decision making, I reviewed the following data within the electronic MEDICAL RECORD NUMBER Notes from prior ED visits and [place] Controlled Substance Database  Patient presents to the ED with family member after she fell inside the house on carpeted steps tumbling down several and hitting her head on the wall.  She reports no loss of consciousness but does report nausea and headache at this time.  No visual changes.  Physical exam shows moderately edematous and tender right ankle.  X-rays of the right ankle were negative and CT of the head and cervical spine was negative for acute injury.  Patient was reassured.  She was given a Norco while in the department.  She was instructed to ice and elevate her ankle.  She was placed in a posterior splint for  support and given a walker which later she declined stating that she would use what she has at home.  Patient was also given a note to remain out of work for 2 days.  She is to follow-up with her PCP if any continued problems.  She was discharged with a prescription for Norco for continued pain management.  ____________________________________________   FINAL CLINICAL IMPRESSION(S) / ED DIAGNOSES  Final diagnoses:  Acute post-traumatic headache, not intractable  Sprain of right ankle, unspecified ligament, initial encounter  Multiple contusions  Fall at home, initial encounter     ED Discharge Orders         Ordered    HYDROcodone-acetaminophen (NORCO/VICODIN) 5-325 MG tablet  Every 4 hours PRN,   Status:  Discontinued     11/05/18 1106    HYDROcodone-acetaminophen (NORCO/VICODIN) 5-325 MG tablet  Every 6 hours PRN     11/05/18 1107           Note:  This document was prepared using Dragon voice recognition software and may include unintentional dictation errors.  Tommi RumpsSummers, Ayame Rena L, PA-C 11/05/18 1713    Sharyn CreamerQuale, Mark, MD 11/06/18 [phone]

## 2018-11-05 NOTE — ED Triage Notes (Signed)
Fell on stairs this am, R ankle, L shoulder and head pain.

## 2018-11-05 NOTE — ED Notes (Signed)
PT IN XRAY

## 2018-11-05 NOTE — Discharge Instructions (Addendum)
Follow-up with your primary care provider if any continued problems.  Ice and elevate your ankle as needed for swelling and to help with pain.  Norco 1 every 6 hours as needed for pain. CT scan of your head and neck were negative but you may continue having a headache today.  Do not drive or operate machinery while taking the pain medication.  A note was written for you to remain out of work this weekend.  Wear the ankle brace for added support and protection.  Use crutches until you are able to bear weight without pain.  Return to the emergency department immediately if any symptoms per head injury information.

## 2019-04-23 ENCOUNTER — Encounter: Payer: Self-pay | Admitting: Emergency Medicine

## 2019-04-23 ENCOUNTER — Emergency Department
Admission: EM | Admit: 2019-04-23 | Discharge: 2019-04-23 | Disposition: A | Discharge status: Home / Self Care | Payer: 59 | Attending: Emergency Medicine | Admitting: Emergency Medicine

## 2019-04-23 ENCOUNTER — Emergency Department: Payer: 59

## 2019-04-23 ENCOUNTER — Other Ambulatory Visit: Payer: Self-pay

## 2019-04-23 DIAGNOSIS — M545 Low back pain, unspecified: Secondary | ICD-10-CM

## 2019-04-23 DIAGNOSIS — Z87442 Personal history of urinary calculi: Secondary | ICD-10-CM | POA: Diagnosis not present

## 2019-04-23 DIAGNOSIS — Z20828 Contact with and (suspected) exposure to other viral communicable diseases: Secondary | ICD-10-CM | POA: Insufficient documentation

## 2019-04-23 DIAGNOSIS — R1030 Lower abdominal pain, unspecified: Secondary | ICD-10-CM | POA: Diagnosis present

## 2019-04-23 DIAGNOSIS — Z9884 Bariatric surgery status: Secondary | ICD-10-CM | POA: Insufficient documentation

## 2019-04-23 DIAGNOSIS — K529 Noninfective gastroenteritis and colitis, unspecified: Secondary | ICD-10-CM | POA: Diagnosis not present

## 2019-04-23 HISTORY — DX: Calculus of kidney: N20.0

## 2019-04-23 LAB — URINALYSIS, COMPLETE (UACMP) WITH MICROSCOPIC
Bilirubin Urine: NEGATIVE
Glucose, UA: NEGATIVE mg/dL
Ketones, ur: NEGATIVE mg/dL
Nitrite: NEGATIVE
Protein, ur: NEGATIVE mg/dL
Specific Gravity, Urine: 1.014 (ref 1.005–1.030)
pH: 5 (ref 5.0–8.0)

## 2019-04-23 LAB — COMPREHENSIVE METABOLIC PANEL
ALT: 14 U/L (ref 0–44)
AST: 24 U/L (ref 15–41)
Albumin: 3.7 g/dL (ref 3.5–5.0)
Alkaline Phosphatase: 66 U/L (ref 38–126)
Anion gap: 9 (ref 5–15)
BUN: 12 mg/dL (ref 6–20)
CO2: 22 mmol/L (ref 22–32)
Calcium: 8.8 mg/dL — ABNORMAL LOW (ref 8.9–10.3)
Chloride: 107 mmol/L (ref 98–111)
Creatinine, Ser: 0.57 mg/dL (ref 0.44–1.00)
GFR calc Af Amer: >60 mL/min (ref >60)
GFR calc non Af Amer: >60 mL/min (ref >60)
Glucose, Bld: 91 mg/dL (ref 70–99)
Potassium: 3.7 mmol/L (ref 3.5–5.1)
Sodium: 138 mmol/L (ref 135–145)
Total Bilirubin: 0.3 mg/dL (ref 0.3–1.2)
Total Protein: 7.2 g/dL (ref 6.5–8.1)

## 2019-04-23 LAB — CBC
HCT: 37.6 % (ref 36.0–46.0)
Hemoglobin: 11.9 g/dL — ABNORMAL LOW (ref 12.0–15.0)
MCH: 26.2 pg (ref 26.0–34.0)
MCHC: 31.6 g/dL (ref 30.0–36.0)
MCV: 82.6 fL (ref 80.0–100.0)
Platelets: 271 10*3/uL (ref 150–400)
RBC: 4.55 MIL/uL (ref 3.87–5.11)
RDW: 13.3 % (ref 11.5–15.5)
WBC: 7 10*3/uL (ref 4.0–10.5)
nRBC: 0 % (ref 0.0–0.2)

## 2019-04-23 LAB — POCT PREGNANCY, URINE: Preg Test, Ur: NEGATIVE

## 2019-04-23 LAB — SARS CORONAVIRUS 2 BY RT PCR (HOSPITAL ORDER, PERFORMED IN ~~LOC~~ HOSPITAL LAB): SARS Coronavirus 2: NEGATIVE

## 2019-04-23 LAB — LIPASE, BLOOD: Lipase: 26 U/L (ref 11–51)

## 2019-04-23 MED ORDER — MORPHINE SULFATE (PF) 4 MG/ML IV SOLN
4.0000 mg | Freq: Once | INTRAVENOUS | Status: AC
  Administered 2019-04-23: 18:00:00 4 mg via INTRAVENOUS
  Filled 2019-04-23: qty 1

## 2019-04-23 MED ORDER — ONDANSETRON 4 MG PO TBDP: 4.0000 mg | ORAL_TABLET | Freq: Three times a day (TID) | ORAL | 0 refills | Status: AC | PRN

## 2019-04-23 MED ORDER — MORPHINE SULFATE (PF) 4 MG/ML IV SOLN
4.0000 mg | Freq: Once | INTRAVENOUS | Status: AC
  Administered 2019-04-23: 19:00:00 4 mg via INTRAVENOUS
  Filled 2019-04-23: qty 1

## 2019-04-23 MED ORDER — ONDANSETRON HCL 4 MG/2ML IJ SOLN
4.0000 mg | Freq: Once | INTRAMUSCULAR | Status: AC
  Administered 2019-04-23: 4 mg via INTRAVENOUS
  Filled 2019-04-23: qty 2

## 2019-04-23 MED ORDER — IOHEXOL 350 MG/ML SOLN
125.0000 mL | Freq: Once | INTRAVENOUS | Status: AC | PRN
  Administered 2019-04-23: 125 mL via INTRAVENOUS

## 2019-04-23 MED ORDER — SODIUM CHLORIDE 0.9 % IV SOLN
Freq: Once | INTRAVENOUS | Status: AC
  Administered 2019-04-23: 18:00:00 via INTRAVENOUS

## 2019-04-23 MED ORDER — DIAZEPAM 5 MG PO TABS: 5.0000 mg | ORAL_TABLET | Freq: Three times a day (TID) | ORAL | 0 refills | Status: DC | PRN

## 2019-04-23 MED ORDER — DIAZEPAM 5 MG PO TABS
5.0000 mg | ORAL_TABLET | Freq: Once | ORAL | Status: AC
  Administered 2019-04-23: 20:00:00 5 mg via ORAL
  Filled 2019-04-23: qty 1

## 2019-04-23 NOTE — ED Notes (Signed)
Patient transported to CT 

## 2019-04-23 NOTE — ED Triage Notes (Signed)
Pt arrived via POV with reports of abdominal pain since Friday, pt states diarrhea and nausea started today.  Pt also reports gastric sleeve.  Pt states the abdominal pain is in the middle of her abdomen and is going to her back. Denies any dysuria.  Pt states abdominal pain has radiated down to groin area.

## 2019-04-23 NOTE — ED Notes (Signed)
Pt up to use bathroom and change into gown at this time

## 2019-04-23 NOTE — ED Notes (Signed)
Reviewed discharge instructions, follow-up care, and prescriptions with patient. Patient verbalized understanding of all information reviewed. Patient stable, with no distress noted at this time.    

## 2019-04-23 NOTE — ED Provider Notes (Signed)
[facility] Emergency Department Provider Note       Time seen: ----------------------------------------- 5:32 PM on 04/23/2019 -----------------------------------------   I have reviewed the triage vital signs and the nursing notes.  HISTORY   Chief Complaint Abdominal Pain    HPI Denise Raymond is a 33 y.o. female with a history of kidney stones who presents to the ED for abdominal pain since Friday.  Patient states diarrhea nausea started today.  Abdominal pain is in the lower abdomen and radiates into her back as well as into her groin area.  Denise Raymond denies fevers, chills or other complaints.  Past Medical History:  Diagnosis Date  . DJD (degenerative joint disease)   . Kidney stone   . PVC (premature ventricular contraction)     There are no active problems to display for this patient.   Past Surgical History:  Procedure Laterality Date  . lap band sugery      Allergies Bentyl [dicyclomine hcl], Latex, Nsaids, and Reglan [metoclopramide]  Social History Social History   Tobacco Use  . Smoking status: Never Smoker  . Smokeless tobacco: Never Used  Substance Use Topics  . Alcohol use: No  . Drug use: No   Review of Systems Constitutional: Negative for fever. Cardiovascular: Negative for chest pain. Respiratory: Negative for shortness of breath. Gastrointestinal: Positive for abdominal pain, nausea and diarrhea Musculoskeletal: Positive for back pain Skin: Negative for rash. Neurological: Negative for headaches, focal weakness or numbness.  All systems negative/normal/unremarkable except as stated in the HPI  ____________________________________________   PHYSICAL EXAM:  VITAL SIGNS: ED Triage Vitals  Enc Vitals Group     BP 04/23/19 1717 135/87     Pulse Rate 04/23/19 1717 84     Resp 04/23/19 1717 (!) 26     Temp 04/23/19 1717 98.1 F (36.7 C)     Temp Source 04/23/19 1717 Oral     SpO2 04/23/19 1717 98 %     Weight  04/23/19 1713 (!) 450 lb (204.1 kg)     Height 04/23/19 1713 5\' 10"  (1.778 m)     Head Circumference --      Peak Flow --      Pain Score 04/23/19 1713 9     Pain Loc --      Pain Edu? --      Excl. in [place]? --    Constitutional: Alert and oriented.  Mild distress from pain Eyes: Conjunctivae are normal. Normal extraocular movements. Cardiovascular: Normal rate, regular rhythm. No murmurs, rubs, or gallops. Respiratory: Normal respiratory effort without tachypnea nor retractions. Breath sounds are clear and equal bilaterally. No wheezes/rales/rhonchi. Gastrointestinal: Soft, nonfocal lower abdominal tenderness, no rebound or guarding.  Normal bowel sounds. Musculoskeletal: Nontender with normal range of motion in extremities. No lower extremity tenderness nor edema. Neurologic:  Normal speech and language. No gross focal neurologic deficits are appreciated.  Skin:  Skin is warm, dry and intact. No rash noted. Psychiatric: Mood and affect are normal. Speech and behavior are normal.  ____________________________________________  ED COURSE:  As part of my medical decision making, I reviewed the following data within the [facility] History obtained from family if available, nursing notes, old chart and ekg, as well as notes from prior ED visits. Patient presented for abdominal pain as well as nausea and diarrhea, we will assess with labs and imaging as indicated at this time.   Procedures  Denise Raymond was evaluated in Emergency Department on 04/23/2019 for the symptoms  described in the history of present illness. Denise Raymond was evaluated in the context of the global COVID-19 pandemic, which necessitated consideration that the patient might be at risk for infection with the SARS-CoV-2 virus that causes COVID-19. Institutional protocols and algorithms that pertain to the evaluation of patients at risk for COVID-19 are in a state of rapid change based on information released by  regulatory bodies including the CDC and federal and state organizations. These policies and algorithms were followed during the patient's care in the ED.  ____________________________________________   LABS (pertinent positives/negatives)  Labs Reviewed  COMPREHENSIVE METABOLIC PANEL - Abnormal; Notable for the following components:      Result Value   Calcium 8.8 (*)    All other components within normal limits  CBC - Abnormal; Notable for the following components:   Hemoglobin 11.9 (*)    All other components within normal limits  URINALYSIS, COMPLETE (UACMP) WITH MICROSCOPIC - Abnormal; Notable for the following components:   Color, Urine YELLOW (*)    APPearance CLOUDY (*)    Hgb urine dipstick MODERATE (*)    Leukocytes,Ua SMALL (*)    Bacteria, UA RARE (*)    All other components within normal limits  URINE CULTURE  SARS CORONAVIRUS 2 (HOSPITAL ORDER, PERFORMED IN [facility])  LIPASE, BLOOD  POC URINE PREG, ED  POCT PREGNANCY, URINE    RADIOLOGY Images were viewed by me  CT the abdomen pelvis with contrast IMPRESSION: 1.  No acute CT findings of the abdomen or pelvis to explain pain.  2.  Hepatomegaly.  3.  Status post cholecystectomy and gastric lap band. ____________________________________________   DIFFERENTIAL DIAGNOSIS   Dehydration, electrolyte abnormality, gastroenteritis, colitis  FINAL ASSESSMENT AND PLAN  Abdominal pain, diarrhea   Plan: The patient had presented for diffuse lower abdominal pain as well as diarrhea and nausea. Patient's labs were reassuring although her urine was not a clean specimen.  We have sent for a urine culture.  Patient's imaging did not reveal any acute process.  Etiology is likely viral plus a muscular component.  Denise Raymond is cleared for outpatient follow-up.   Ulice DashJohnathan E , MD    Note: This note was generated in part or whole with voice recognition software. Voice recognition is usually quite  accurate but there are transcription errors that can and very often do occur. I apologize for any typographical errors that were not detected and corrected.     Emily Filbert,  E, MD 04/23/19 2025

## 2019-04-24 LAB — URINE CULTURE

## 2019-04-27 ENCOUNTER — Emergency Department: Payer: 59

## 2019-04-27 ENCOUNTER — Encounter: Payer: Self-pay | Admitting: Emergency Medicine

## 2019-04-27 ENCOUNTER — Other Ambulatory Visit: Payer: Self-pay

## 2019-04-27 ENCOUNTER — Emergency Department
Admission: EM | Admit: 2019-04-27 | Discharge: 2019-04-27 | Disposition: A | Discharge status: Home / Self Care | Payer: 59 | Attending: Student in an Organized Health Care Education/Training Program | Admitting: Student in an Organized Health Care Education/Training Program

## 2019-04-27 DIAGNOSIS — Z79899 Other long term (current) drug therapy: Secondary | ICD-10-CM | POA: Insufficient documentation

## 2019-04-27 DIAGNOSIS — Z91040 Latex allergy status: Secondary | ICD-10-CM | POA: Insufficient documentation

## 2019-04-27 DIAGNOSIS — M5442 Lumbago with sciatica, left side: Secondary | ICD-10-CM | POA: Diagnosis not present

## 2019-04-27 DIAGNOSIS — M5417 Radiculopathy, lumbosacral region: Secondary | ICD-10-CM | POA: Diagnosis not present

## 2019-04-27 DIAGNOSIS — M545 Low back pain: Secondary | ICD-10-CM | POA: Diagnosis present

## 2019-04-27 DIAGNOSIS — M5441 Lumbago with sciatica, right side: Secondary | ICD-10-CM | POA: Diagnosis not present

## 2019-04-27 LAB — URINALYSIS, ROUTINE W REFLEX MICROSCOPIC
Bacteria, UA: NONE SEEN
Bilirubin Urine: NEGATIVE
Glucose, UA: NEGATIVE mg/dL
Ketones, ur: NEGATIVE mg/dL
Nitrite: NEGATIVE
Protein, ur: 30 mg/dL — AB
Specific Gravity, Urine: 1.024 (ref 1.005–1.030)
pH: 7 (ref 5.0–8.0)

## 2019-04-27 LAB — POCT PREGNANCY, URINE: Preg Test, Ur: NEGATIVE

## 2019-04-27 MED ORDER — KETOROLAC TROMETHAMINE 30 MG/ML IJ SOLN
60.0000 mg | Freq: Once | INTRAMUSCULAR | Status: AC
  Administered 2019-04-27: 20:00:00 60 mg via INTRAMUSCULAR
  Filled 2019-04-27: qty 2

## 2019-04-27 MED ORDER — OXYCODONE-ACETAMINOPHEN 5-325 MG PO TABS: 1.0000 | ORAL_TABLET | Freq: Three times a day (TID) | ORAL | 0 refills | Status: AC | PRN

## 2019-04-27 MED ORDER — ORPHENADRINE CITRATE 30 MG/ML IJ SOLN
60.0000 mg | INTRAMUSCULAR | Status: AC
  Administered 2019-04-27: 60 mg via INTRAMUSCULAR
  Filled 2019-04-27: qty 2

## 2019-04-27 MED ORDER — PREDNISONE 20 MG PO TABS: 40.0000 mg | ORAL_TABLET | Freq: Every day | ORAL | 0 refills | Status: AC

## 2019-04-27 MED ORDER — GABAPENTIN 300 MG PO CAPS: 300.0000 mg | ORAL_CAPSULE | Freq: Three times a day (TID) | ORAL | 0 refills | Status: DC

## 2019-04-27 MED ORDER — HYDROMORPHONE HCL 1 MG/ML IJ SOLN
1.0000 mg | Freq: Once | INTRAMUSCULAR | Status: AC
  Administered 2019-04-27: 1 mg via INTRAMUSCULAR
  Filled 2019-04-27: qty 1

## 2019-04-27 NOTE — ED Provider Notes (Signed)
[facility] Emergency Department Provider Note ____________________________________________  Time seen: 1730  I have reviewed the triage vital signs and the nursing notes.  HISTORY  Chief Complaint  Back Pain  HPI Denise Raymond is a 33 y.o. female presents herself to the ED for evaluation of low back pain.  Patient was evaluated here on Sunday for similar complaints, but reports the pain is worse today patient denies any recent or remote injuries.  She describes pain is worsened with movement and palpation of the lower back.  Patient denies any bladder or bowel incontinence, foot drop, saddle anesthesias, or distal paresthesias.  Past Medical History:  Diagnosis Date  . DJD (degenerative joint disease)   . Kidney stone   . PVC (premature ventricular contraction)     There are no active problems to display for this patient.   Past Surgical History:  Procedure Laterality Date  . lap band sugery      Prior to Admission medications   Medication Sig Start Date End Date Taking? Authorizing Provider  baclofen (LIORESAL) 10 MG tablet Take 10 mg by mouth daily. 02/17/18   [provider]  diazepam (VALIUM) 5 MG tablet Take 1 tablet (5 mg total) by mouth every 8 (eight) hours as needed for muscle spasms. 04/23/19   Emily FilbertWilliams, Jonathan E, MD  ESTARYLLA 0.25-35 MG-MCG tablet Take 1 tablet by mouth as directed. 12/29/17   [provider]  gabapentin (NEURONTIN) 300 MG capsule Take 1 capsule (300 mg total) by mouth 3 (three) times daily for 10 days. 04/27/19 05/07/19  Kesean Serviss, Charlesetta IvoryJenise V Bacon, PA-C  metoprolol succinate (TOPROL-XL) 25 MG 24 hr tablet Take 1 tablet by mouth daily. 02/09/18   [provider]  nortriptyline (PAMELOR) 10 MG capsule Take 30 mg by mouth every evening. 02/21/18   [provider]  nystatin (MYCOSTATIN/NYSTOP) powder Apply topically 4 (four) times daily. 02/23/18   Sharman CheekStafford, Phillip, MD  ondansetron (ZOFRAN ODT) 4 MG  disintegrating tablet Take 1 tablet (4 mg total) by mouth every 8 (eight) hours as needed for nausea or vomiting. 02/23/18   Sharman CheekStafford, Phillip, MD  ondansetron (ZOFRAN ODT) 4 MG disintegrating tablet Take 1 tablet (4 mg total) by mouth every 8 (eight) hours as needed for nausea or vomiting. 04/23/19   Emily FilbertWilliams, Jonathan E, MD  oxyCODONE-acetaminophen (PERCOCET) 5-325 MG tablet Take 1 tablet by mouth every 8 (eight) hours as needed for up to 5 days for severe pain. 04/27/19 05/02/19  Melani Brisbane, Charlesetta IvoryJenise V Bacon, PA-C  predniSONE (DELTASONE) 20 MG tablet Take 2 tablets (40 mg total) by mouth daily with breakfast for 5 days. 04/27/19 05/02/19  Ernesha Ramone, Charlesetta IvoryJenise V Bacon, PA-C  rizatriptan (MAXALT-MLT) 10 MG disintegrating tablet Take 1-2 tablets by mouth daily as needed. 01/26/18   [provider]  sertraline (ZOLOFT) 100 MG tablet Take 100 mg by mouth daily. 12/07/17   [provider]    Allergies Bentyl [dicyclomine hcl], Latex, Nsaids, and Reglan [metoclopramide]  No family history on file.  Social History Social History   Tobacco Use  . Smoking status: Never Smoker  . Smokeless tobacco: Never Used  Substance Use Topics  . Alcohol use: No  . Drug use: No    Review of Systems  Constitutional: Negative for fever. Eyes: Negative for visual changes. ENT: Negative for sore throat. Cardiovascular: Negative for chest pain. Respiratory: Negative for shortness of breath. Gastrointestinal: Negative for abdominal pain, vomiting and diarrhea. Genitourinary: Negative for dysuria. Musculoskeletal: Positive for back pain with BLE  referral  Skin: Negative for rash. Neurological: Negative for headaches, focal weakness or numbness. ____________________________________________  PHYSICAL EXAM:  VITAL SIGNS: ED Triage Vitals  Enc Vitals Group     BP 04/27/19 1859 (!) 147/101     Pulse Rate 04/27/19 1859 (!) 104     Resp 04/27/19 1859 17     Temp 04/27/19 1859 98.3 F (36.8 C)     Temp  Source 04/27/19 1859 Oral     SpO2 04/27/19 1859 97 %     Weight 04/27/19 1900 (!) 462 lb 15.5 oz (210 kg)     Height 04/27/19 1900 5\' 10"  (1.778 m)     Head Circumference --      Peak Flow --      Pain Score 04/27/19 1859 10     Pain Loc --      Pain Edu? --      Excl. in [place]? --     Constitutional: Alert and oriented. Well appearing and in no distress. Head: Normocephalic and atraumatic. Eyes: Conjunctivae are normal. PERRL. Normal extraocular movements Ears: Canals clear. TMs intact bilaterally. Nose: No congestion/rhinorrhea/epistaxis. Mouth/Throat: Mucous membranes are moist. Neck: Supple. No thyromegaly. Hematological/Lymphatic/Immunological: No cervical lymphadenopathy. Cardiovascular: Normal rate, regular rhythm. Normal distal pulses. Respiratory: Normal respiratory effort. No wheezes/rales/rhonchi. Gastrointestinal: Soft, obese, and nontender. No distention. Musculoskeletal: Normal spinal alignment without midline tenderness, spsasm or deformity. Paraspinal muscle tenderness bilaterally. Nontender with normal range of motion in all extremities.  Neurologic: CN II-XII grossly intact. Normal LE DTRs bilaterally. Slow sit to stand transition without assistance. Normal gait without ataxia. Normal speech and language. No gross focal neurologic deficits are appreciated. Skin:  Skin is warm, dry and intact. No rash noted. Psychiatric: Mood and affect are normal. Patient exhibits appropriate insight and judgment. ____________________________________________   LABS (pertinent positives/negatives) Labs Reviewed  URINALYSIS, ROUTINE W REFLEX MICROSCOPIC - Abnormal; Notable for the following components:      Result Value   Color, Urine YELLOW (*)    APPearance HAZY (*)    Hgb urine dipstick SMALL (*)    Protein, ur 30 (*)    Leukocytes,Ua TRACE (*)    All other components within normal limits  POC URINE PREG, ED  POCT PREGNANCY, URINE   ____________________________________________   RADIOLOGY  CT Lumbar Spine w/o CM  IMPRESSION: Multilevel degenerative change in the lumbar spine. No significant spinal stenosis  Subarticular stenosis on the left L5-S1 due to spurring. This could affect the left S1 nerve root. ____________________________________________  PROCEDURES  Procedures Toradol 60 mg IM Norflex 60 mg IM Dilaudid 1 mg IM ____________________________________________  INITIAL IMPRESSION / ASSESSMENT AND PLAN / ED COURSE  Denise Raymond was evaluated in Emergency Department on 04/27/2019 for the symptoms described in the history of present illness. She was evaluated in the context of the global COVID-19 pandemic, which necessitated consideration that the patient might be at risk for infection with the SARS-CoV-2 virus that causes COVID-19. Institutional protocols and algorithms that pertain to the evaluation of patients at risk for COVID-19 are in a state of rapid change based on information released by regulatory bodies including the CDC and federal and state organizations. These policies and algorithms were followed during the patient's care in the ED.  Patient with ED evaluation of acutely worsening bilateral low back pain with some bilateral lower extremity referral.  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  Patient CT scan does reveal multilevel DDD with  a significant endplate spurring affecting the left S1 nerve root.  Patient will be discharged at this time to follow-up with orthopedics for ongoing symptom management. Patient will be discharged with prescriptions for Gabapentin, Prednisone, and Percocet (#15). Return precautions have been reviewed.  ____________________________________________  FINAL CLINICAL IMPRESSION(S) / ED DIAGNOSES  Final diagnoses:  Acute bilateral low back pain with bilateral sciatica  Lumbosacral radiculopathy       Clarice Bonaventure, Dannielle Karvonen, PA-C 04/27/19 2212    Merlyn Lot, MD 04/28/19 1431

## 2019-04-27 NOTE — Discharge Instructions (Addendum)
Your exam and CT scan confirm lumbar DDD and radiculopathy. Take the prescription meds as directed. Follow-up ortho for ongoing management. Return to the ED as needed.

## 2019-04-27 NOTE — ED Notes (Signed)
Back pain x 1 wk. Pt seen x 6 days ago. Pt states back pain worse today.

## 2019-04-27 NOTE — ED Notes (Signed)
Pt returned to ED Rm 42 from CT at this time.

## 2019-04-27 NOTE — ED Triage Notes (Signed)
PT arrives with complaints of lower back pain. Pt states she was seen Sunday for same complaints but reports today the pain "is worse." No injury reported. Pain is worse with movement and palpation

## 2019-07-20 ENCOUNTER — Encounter: Payer: Self-pay | Admitting: Emergency Medicine

## 2019-07-20 ENCOUNTER — Emergency Department
Admission: EM | Admit: 2019-07-20 | Discharge: 2019-07-20 | Disposition: A | Discharge status: Home / Self Care | Payer: 59 | Attending: Emergency Medicine | Admitting: Emergency Medicine

## 2019-07-20 ENCOUNTER — Emergency Department: Payer: 59

## 2019-07-20 ENCOUNTER — Other Ambulatory Visit: Payer: Self-pay

## 2019-07-20 DIAGNOSIS — Z91040 Latex allergy status: Secondary | ICD-10-CM | POA: Diagnosis not present

## 2019-07-20 DIAGNOSIS — Z79899 Other long term (current) drug therapy: Secondary | ICD-10-CM | POA: Insufficient documentation

## 2019-07-20 DIAGNOSIS — M25551 Pain in right hip: Secondary | ICD-10-CM | POA: Insufficient documentation

## 2019-07-20 LAB — CBC WITH DIFFERENTIAL/PLATELET
Abs Immature Granulocytes: 0.02 10*3/uL (ref 0.00–0.07)
Basophils Absolute: 0 10*3/uL (ref 0.0–0.1)
Basophils Relative: 1 %
Eosinophils Absolute: 0.1 10*3/uL (ref 0.0–0.5)
Eosinophils Relative: 2 %
HCT: 34.3 % — ABNORMAL LOW (ref 36.0–46.0)
Hemoglobin: 11 g/dL — ABNORMAL LOW (ref 12.0–15.0)
Immature Granulocytes: 0 %
Lymphocytes Relative: 28 %
Lymphs Abs: 1.7 10*3/uL (ref 0.7–4.0)
MCH: 26.6 pg (ref 26.0–34.0)
MCHC: 32.1 g/dL (ref 30.0–36.0)
MCV: 83.1 fL (ref 80.0–100.0)
Monocytes Absolute: 0.5 10*3/uL (ref 0.1–1.0)
Monocytes Relative: 8 %
Neutro Abs: 3.7 10*3/uL (ref 1.7–7.7)
Neutrophils Relative %: 61 %
Platelets: 298 10*3/uL (ref 150–400)
RBC: 4.13 MIL/uL (ref 3.87–5.11)
RDW: 13.1 % (ref 11.5–15.5)
WBC: 6.1 10*3/uL (ref 4.0–10.5)
nRBC: 0 % (ref 0.0–0.2)

## 2019-07-20 LAB — BASIC METABOLIC PANEL
Anion gap: 11 (ref 5–15)
BUN: 11 mg/dL (ref 6–20)
CO2: 25 mmol/L (ref 22–32)
Calcium: 9.3 mg/dL (ref 8.9–10.3)
Chloride: 102 mmol/L (ref 98–111)
Creatinine, Ser: 0.67 mg/dL (ref 0.44–1.00)
GFR calc Af Amer: >60 mL/min (ref >60)
GFR calc non Af Amer: >60 mL/min (ref >60)
Glucose, Bld: 93 mg/dL (ref 70–99)
Potassium: 3.8 mmol/L (ref 3.5–5.1)
Sodium: 138 mmol/L (ref 135–145)

## 2019-07-20 LAB — SEDIMENTATION RATE: Sed Rate: 75 mm/hr — ABNORMAL HIGH (ref 0–20)

## 2019-07-20 MED ORDER — HYDROMORPHONE HCL 1 MG/ML IJ SOLN
1.0000 mg | INTRAMUSCULAR | Status: AC
  Administered 2019-07-20: 18:00:00 1 mg via INTRAVENOUS
  Filled 2019-07-20: qty 1

## 2019-07-20 MED ORDER — HYDROMORPHONE HCL 1 MG/ML IJ SOLN
1.0000 mg | INTRAMUSCULAR | Status: AC
  Administered 2019-07-20: 1 mg via INTRAVENOUS
  Filled 2019-07-20: qty 1

## 2019-07-20 MED ORDER — ONDANSETRON HCL 4 MG/2ML IJ SOLN
4.0000 mg | Freq: Once | INTRAMUSCULAR | Status: AC
  Administered 2019-07-20: 4 mg via INTRAVENOUS
  Filled 2019-07-20: qty 2

## 2019-07-20 MED ORDER — HYDROMORPHONE HCL 1 MG/ML IJ SOLN: 1.0000 mg | INTRAMUSCULAR | Status: DC

## 2019-07-20 MED ORDER — HYDROMORPHONE HCL 1 MG/ML IJ SOLN
1.0000 mg | Freq: Once | INTRAMUSCULAR | Status: AC
  Administered 2019-07-20: 1 mg via INTRAVENOUS
  Filled 2019-07-20: qty 1

## 2019-07-20 MED ORDER — LORAZEPAM 2 MG/ML IJ SOLN
0.5000 mg | Freq: Once | INTRAMUSCULAR | Status: AC
  Administered 2019-07-20: 0.5 mg via INTRAVENOUS
  Filled 2019-07-20: qty 1

## 2019-07-20 NOTE — ED Provider Notes (Signed)
Continuing to await ALS transport, have contacted [facility] ground, [facility], and also EMS.  At present time awaiting EMS transport is neither [facility] or [facility] able to properly provide transport, [facility] estimating greater than 7 hours.  Patient understand plan, pain again returning.  Additional hydromorphone ordered.  She is awake and alert, remains stable for transfer at this time.  Ongoing care and disposition assigned to Dr. Ellouise Newer, MD 07/20/19 2213

## 2019-07-20 NOTE — ED Provider Notes (Signed)
Patient reports pain returning.  She is fully awake and alert, comfortable and waiting for transfer to [facility] but appears in pain having ongoing hip pain.  Additional hydromorphone ordered.  Patient alert and stable   Delman Kitten, MD 07/20/19 2001

## 2019-07-20 NOTE — ED Provider Notes (Signed)
Patient resting comfortably, stable vital signs.  Does report she gets a bit anxious at times.  She is comfortable with plan to receive small dose of Ativan.  She is fully awake and alert.  EMS providers at bedside preparing to transport the patient to [facility].  She is stable for transfer   Delman Kitten, MD 07/20/19 2237

## 2019-07-20 NOTE — ED Notes (Signed)
Hip pain started Friday, reports hip and leg swelling starting friday. Pt also reports that she feels her hips and feet are still swollen. Pain has been back since she was discharged from [facility] Monday for postop complications.

## 2019-07-20 NOTE — ED Notes (Signed)
Responded to call bell. Pt able to ambulate to bathroom to urinate with minimal assistance. Pt performed self hygiene and returned to bed. Pt resting comfortably.

## 2019-07-20 NOTE — ED Provider Notes (Addendum)
[facility] Emergency Department Provider Note ____________________________________________   First MD Initiated Contact with Patient 07/20/19 1711     (approximate)  I have reviewed the triage vital signs and the nursing notes.  HISTORY  Chief Complaint Hip Pain  HPI Denise Raymond is a 33 y.o. female  h/o L5-S1 laminectomy 05/24/19 with revision on 06/19/19 and I&D due to S. epi disk infection on prolonged course of vancomycin.  Patient reports that she started having new pain is severe located shooting towards the right hip area on the day of discharge, she reports she reported this to her team at [facility] who recommended that she still be discharged.  She was discharged to home and reports has had severe and increasing pain in the right hip area, she is having lots of difficulty being able to even walk because of the severity of pain.  10 out of 10 located in the right hip.  There is no rash or swelling.  Both of her legs been very swollen and she was evaluated at [facility] for blood clots and found to be negative.  She reports severe 10 out of 10 pain, at times when the pain is severe it feels like her right foot sort of goes "numb".  Currently she is experiencing some decreased sensation in the right lower leg denies loss of muscle strength except when the pain is severe, but reports she is just in so much pain if she tries to move the right hip to the point she cannot even walk or move it because of the severity of pain.  She is using oxycodone as prescribed without relief  She has had no further fevers.  She is on continuous vancomycin infusion and following with infectious disease and orthopedics at [facility].  Her doctor advised her to go to the pain center at [facility] today but they would not be able to treat her because this is a new area of pain that just started to arise when she was discharged from the hospital infection reports her surgical site and low low back actually feels  better   Past Medical History:  Diagnosis Date   DJD (degenerative joint disease)    Kidney stone    PVC (premature ventricular contraction)     There are no active problems to display for this patient.   Past Surgical History:  Procedure Laterality Date   lap band sugery      Prior to Admission medications   Medication Sig Start Date End Date Taking? Authorizing Provider  baclofen (LIORESAL) 10 MG tablet Take 10 mg by mouth daily. 02/17/18   [provider]  diazepam (VALIUM) 5 MG tablet Take 1 tablet (5 mg total) by mouth every 8 (eight) hours as needed for muscle spasms. 04/23/19   Earleen Newport, MD  ESTARYLLA 0.25-35 MG-MCG tablet Take 1 tablet by mouth as directed. 12/29/17   [provider]  gabapentin (NEURONTIN) 300 MG capsule Take 1 capsule (300 mg total) by mouth 3 (three) times daily for 10 days. 04/27/19 05/07/19  Menshew, Dannielle Karvonen, PA-C  metoprolol succinate (TOPROL-XL) 25 MG 24 hr tablet Take 1 tablet by mouth daily. 02/09/18   [provider]  nortriptyline (PAMELOR) 10 MG capsule Take 30 mg by mouth every evening. 02/21/18   [provider]  nystatin (MYCOSTATIN/NYSTOP) powder Apply topically 4 (four) times daily. 02/23/18   Carrie Mew, MD  ondansetron (ZOFRAN ODT) 4 MG disintegrating tablet Take 1 tablet (4 mg total)  by mouth every 8 (eight) hours as needed for nausea or vomiting. 02/23/18   Carrie Mew, MD  ondansetron (ZOFRAN ODT) 4 MG disintegrating tablet Take 1 tablet (4 mg total) by mouth every 8 (eight) hours as needed for nausea or vomiting. 04/23/19   Earleen Newport, MD  rizatriptan (MAXALT-MLT) 10 MG disintegrating tablet Take 1-2 tablets by mouth daily as needed. 01/26/18   [provider]  sertraline (ZOLOFT) 100 MG tablet Take 100 mg by mouth daily. 12/07/17   [provider]    Allergies Bentyl [dicyclomine hcl], Latex, Nsaids, and Reglan [metoclopramide]  No family history on  file.  Social History Social History   Tobacco Use   Smoking status: Never Smoker   Smokeless tobacco: Never Used  Substance Use Topics   Alcohol use: No   Drug use: No    Review of Systems Constitutional: No fever/chills Eyes: No visual changes. Cardiovascular: Denies chest pain. Respiratory: Denies shortness of breath. Gastrointestinal: No abdominal pain.  Denies pregnancy.  Not sexually active since the time of her surgery. Genitourinary: Negative for dysuria.  No loss of bowel or bladder control. Musculoskeletal: Negative for back pain.  Reports severe pain in the right hip area.  No pain in the right knee.  No pain anywhere else just severe 10 out of 10 pain in the right hip. Skin: Negative for rash. Neurological: Negative for headaches, areas of focal weakness or numbness.    ____________________________________________   PHYSICAL EXAM:  VITAL SIGNS: ED Triage Vitals  Enc Vitals Group     BP 07/20/19 1217 117/70     Pulse Rate 07/20/19 1217 72     Resp 07/20/19 1217 16     Temp 07/20/19 1217 98.6 F (37 C)     Temp Source 07/20/19 1217 Oral     SpO2 07/20/19 1217 97 %     Weight 07/20/19 1217 (!) 485 lb (220 kg)     Height 07/20/19 1217 5' 10"  (1.778 m)     Head Circumference --      Peak Flow --      Pain Score 07/20/19 1224 9     Pain Loc --      Pain Edu? --      Excl. in [place]? --     Constitutional: Alert and oriented.  Nontoxic in appearance, but appears in pain, wincing.  Appears severe pain in the right hip region Eyes: Conjunctivae are normal. Head: Atraumatic. Nose: No congestion/rhinnorhea. Mouth/Throat: Mucous membranes are moist. Neck: No stridor. Cardiovascular: Normal rate, regular rhythm. Grossly normal heart sounds.  Good peripheral circulation. Respiratory: Normal respiratory effort.  No retractions. Lungs CTAB. Gastrointestinal: Soft and nontender. No distention. Musculoskeletal: No lower extremity tenderness nor edema.  Unable to  reproduce the pain that she reports is deep-seated in the right hip to palpation.  There is no swelling or erythema Neurologic:  Normal speech and language. No gross focal neurologic deficits are appreciated.  Skin:  Skin is warm, dry and intact. No rash noted. Psychiatric: Mood and affect are normal. Speech and behavior are normal.  ____________________________________________   LABS (all labs ordered are listed, but only abnormal results are displayed)  Labs Reviewed  CBC WITH DIFFERENTIAL/PLATELET - Abnormal; Notable for the following components:      Result Value   Hemoglobin 11.0 (*)    HCT 34.3 (*)    All other components within normal limits  SEDIMENTATION RATE - Abnormal; Notable for the following components:   Sed Rate  75 (*)    All other components within normal limits  BASIC METABOLIC PANEL  C-REACTIVE PROTEIN   ____________________________________________  EKG   ____________________________________________  RADIOLOGY  Dg Hip Unilat W Or Wo Pelvis 2-3 Views Right  Result Date: 07/20/2019 CLINICAL DATA:  PT arrives with complaints of right hip pain that started last Friday. Pt denies known injury. Pt has complicated recent surgical HX; pt contacted surgeon who suggested pt come to the ED or pain clinic. EXAM: DG HIP (WITH OR WITHOUT PELVIS) 2-3V RIGHT COMPARISON:  CT of the abdomen and pelvis on 04/23/2019 FINDINGS: There is no evidence of hip fracture or dislocation. There is no evidence of arthropathy or other focal bone abnormality. IMPRESSION: Negative. Electronically Signed   By: Nolon Nations M.D.   On: 07/20/2019 14:12     ____________________________________________   PROCEDURES  Procedure(s) performed: None  Procedures  Critical Care performed: No  ____________________________________________   INITIAL IMPRESSION / ASSESSMENT AND PLAN / ED COURSE  Pertinent labs & imaging results that were available during my care of the patient were  reviewed by me and considered in my medical decision making (see chart for details).   Right hip pain, located in the area of her lower back I will question if this could be related to her recent infection in this lumbar disc surgery.  She reports she started to have this pain discomfort while at [facility], but it is worse in the point now she feels that she cannot walk.  I discussed the case with [facility] transfer center, they contacted the attending physician, and the patient has been accepted in transfer the [place] ED by Dr. Claiborne Rigg the patient's orthopedic spine physician.  They would like for her to be transferred for further evaluation especially in the given the complexity of her current care.  I discussed with the patient, she is very much in agreement, she cannot tolerate sitting still in the car and is having significant difficulty and inability to ambulate due to pain, she is hemodynamically and physically stable for transfer for further evaluation.  I believe that she would benefit from transfer with further work-up by her spine surgeon.  Patient agreement with plan.  Clinical Course as of Jul 19 2228  Thu Jul 20, 2019  1735 [facility] paging ortho attending to review case and obtain treatment recommendations. Discussed with our MRI team, not certain if patient would fit in the MRI here.    [MQ]  1918 Patient resting, awaiting transfer to [facility].  ESR elevated   [MQ]  2058 Continue to wait comfortably for transport.   [MQ]    Clinical Course User Index [MQ] Delman Kitten, MD     ____________________________________________   FINAL CLINICAL IMPRESSION(S) / ED DIAGNOSES  Final diagnoses:  Right hip pain        Note:  This document was prepared using Dragon voice recognition software and may include unintentional dictation errors       Delman Kitten, MD 07/20/19 6962    Delman Kitten, MD 07/20/19 2229

## 2019-07-20 NOTE — ED Triage Notes (Addendum)
PT arrives with complaints of right hip pain that started last Friday. Pt denies known injury. Pt has complicated recent surgical HX; pt contacted surgeon who suggested pt come to the ED or pain clinic. Pt reports pain was present during her recent hospital admission (was d/c Monday) but reports "they didn't do anything about it." Pt states she is here for pain management and "to make sure nothing it wrong with it."

## 2019-07-21 LAB — C-REACTIVE PROTEIN: CRP: 6.6 mg/dL — ABNORMAL HIGH (ref <1.0)

## 2019-09-14 ENCOUNTER — Other Ambulatory Visit: Payer: Self-pay

## 2019-09-14 ENCOUNTER — Emergency Department
Admission: EM | Admit: 2019-09-14 | Discharge: 2019-09-15 | Disposition: A | Discharge status: Home / Self Care | Payer: 59 | Attending: Emergency Medicine | Admitting: Emergency Medicine

## 2019-09-14 ENCOUNTER — Emergency Department: Payer: 59

## 2019-09-14 ENCOUNTER — Encounter: Payer: Self-pay | Admitting: Intensive Care

## 2019-09-14 DIAGNOSIS — Z20828 Contact with and (suspected) exposure to other viral communicable diseases: Secondary | ICD-10-CM | POA: Insufficient documentation

## 2019-09-14 DIAGNOSIS — R6883 Chills (without fever): Secondary | ICD-10-CM | POA: Diagnosis not present

## 2019-09-14 DIAGNOSIS — R700 Elevated erythrocyte sedimentation rate: Secondary | ICD-10-CM | POA: Diagnosis not present

## 2019-09-14 DIAGNOSIS — Z91040 Latex allergy status: Secondary | ICD-10-CM | POA: Diagnosis not present

## 2019-09-14 DIAGNOSIS — M545 Low back pain, unspecified: Secondary | ICD-10-CM

## 2019-09-14 HISTORY — DX: Juvenile osteochondrosis of spine, site unspecified: M42.00

## 2019-09-14 HISTORY — DX: Obesity, unspecified: E66.9

## 2019-09-14 LAB — COMPREHENSIVE METABOLIC PANEL
ALT: 19 U/L (ref 0–44)
AST: 19 U/L (ref 15–41)
Albumin: 3.6 g/dL (ref 3.5–5.0)
Alkaline Phosphatase: 89 U/L (ref 38–126)
Anion gap: 10 (ref 5–15)
BUN: 14 mg/dL (ref 6–20)
CO2: 23 mmol/L (ref 22–32)
Calcium: 9.2 mg/dL (ref 8.9–10.3)
Chloride: 103 mmol/L (ref 98–111)
Creatinine, Ser: 0.65 mg/dL (ref 0.44–1.00)
GFR calc Af Amer: >60 mL/min (ref >60)
GFR calc non Af Amer: >60 mL/min (ref >60)
Glucose, Bld: 102 mg/dL — ABNORMAL HIGH (ref 70–99)
Potassium: 4.2 mmol/L (ref 3.5–5.1)
Sodium: 136 mmol/L (ref 135–145)
Total Bilirubin: 0.2 mg/dL — ABNORMAL LOW (ref 0.3–1.2)
Total Protein: 7.7 g/dL (ref 6.5–8.1)

## 2019-09-14 LAB — CBC WITH DIFFERENTIAL/PLATELET
Abs Immature Granulocytes: 0.02 10*3/uL (ref 0.00–0.07)
Basophils Absolute: 0 10*3/uL (ref 0.0–0.1)
Basophils Relative: 1 %
Eosinophils Absolute: 0.1 10*3/uL (ref 0.0–0.5)
Eosinophils Relative: 1 %
HCT: 38.5 % (ref 36.0–46.0)
Hemoglobin: 12.5 g/dL (ref 12.0–15.0)
Immature Granulocytes: 0 %
Lymphocytes Relative: 26 %
Lymphs Abs: 2.3 10*3/uL (ref 0.7–4.0)
MCH: 26.7 pg (ref 26.0–34.0)
MCHC: 32.5 g/dL (ref 30.0–36.0)
MCV: 82.1 fL (ref 80.0–100.0)
Monocytes Absolute: 0.5 10*3/uL (ref 0.1–1.0)
Monocytes Relative: 6 %
Neutro Abs: 5.9 10*3/uL (ref 1.7–7.7)
Neutrophils Relative %: 66 %
Platelets: 313 10*3/uL (ref 150–400)
RBC: 4.69 MIL/uL (ref 3.87–5.11)
RDW: 13.1 % (ref 11.5–15.5)
WBC: 8.9 10*3/uL (ref 4.0–10.5)
nRBC: 0 % (ref 0.0–0.2)

## 2019-09-14 LAB — URINALYSIS, ROUTINE W REFLEX MICROSCOPIC
Bilirubin Urine: NEGATIVE
Glucose, UA: NEGATIVE mg/dL
Ketones, ur: NEGATIVE mg/dL
Nitrite: NEGATIVE
Protein, ur: 30 mg/dL — AB
RBC / HPF: >50 RBC/hpf — ABNORMAL HIGH (ref 0–5)
Specific Gravity, Urine: 1.021 (ref 1.005–1.030)
pH: 5 (ref 5.0–8.0)

## 2019-09-14 LAB — SEDIMENTATION RATE: Sed Rate: 44 mm/hr — ABNORMAL HIGH (ref 0–20)

## 2019-09-14 LAB — RESPIRATORY PANEL BY RT PCR (FLU A&B, COVID)
Influenza A by PCR: NEGATIVE
Influenza B by PCR: NEGATIVE
SARS Coronavirus 2 by RT PCR: NEGATIVE

## 2019-09-14 LAB — PREGNANCY, URINE: Preg Test, Ur: NEGATIVE

## 2019-09-14 LAB — POC SARS CORONAVIRUS 2 AG: SARS Coronavirus 2 Ag: NEGATIVE

## 2019-09-14 MED ORDER — HYDROMORPHONE HCL 1 MG/ML IJ SOLN
2.0000 mg | Freq: Once | INTRAMUSCULAR | Status: AC | PRN
  Administered 2019-09-15: 2 mg via INTRAVENOUS
  Filled 2019-09-14: qty 2

## 2019-09-14 MED ORDER — HYDROMORPHONE HCL 1 MG/ML IJ SOLN
2.0000 mg | Freq: Once | INTRAMUSCULAR | Status: AC
  Administered 2019-09-14: 2 mg via INTRAVENOUS
  Filled 2019-09-14: qty 2

## 2019-09-14 MED ORDER — HYDROMORPHONE HCL 1 MG/ML IJ SOLN
1.0000 mg | Freq: Once | INTRAMUSCULAR | Status: AC
  Administered 2019-09-14: 21:00:00 1 mg via INTRAVENOUS
  Filled 2019-09-14: qty 1

## 2019-09-14 MED ORDER — SODIUM CHLORIDE 0.9 % IV BOLUS
1000.0000 mL | Freq: Once | INTRAVENOUS | Status: AC
  Administered 2019-09-14: 1000 mL via INTRAVENOUS

## 2019-09-14 MED ORDER — GADOBUTROL 1 MMOL/ML IV SOLN
10.0000 mL | Freq: Once | INTRAVENOUS | Status: AC | PRN
  Administered 2019-09-14: 10 mL via INTRAVENOUS

## 2019-09-14 MED ORDER — ONDANSETRON HCL 4 MG/2ML IJ SOLN
4.0000 mg | Freq: Once | INTRAMUSCULAR | Status: AC
  Administered 2019-09-14: 4 mg via INTRAVENOUS
  Filled 2019-09-14: qty 2

## 2019-09-14 MED ORDER — LORAZEPAM 2 MG/ML IJ SOLN
1.0000 mg | Freq: Once | INTRAMUSCULAR | Status: AC
  Administered 2019-09-14: 22:00:00 1 mg via INTRAVENOUS
  Filled 2019-09-14: qty 1

## 2019-09-14 NOTE — ED Triage Notes (Addendum)
Presents with back pain. In august 2020 had MICRODISCECTOMY TUBULAR W/ROOT DECOMP MEDS L5-S1 AND in September 2020 had LAMINOTOMY / HEMILAMINECTOMY WITH DECOMPRESSION ONE INTERSPACE LUMBAR L5-S1. Reports she was seen at [facility] next door today for back pain and was sent here to be examined for possible infection at surgery site. Also c/o incontinence of stool and urine intermittently X1 week

## 2019-09-14 NOTE — ED Provider Notes (Signed)
[facility] Emergency Department Provider Note  ____________________________________________   First MD Initiated Contact with Patient 09/14/19 2032     (approximate)  I have reviewed the triage vital signs and the nursing notes.   HISTORY  Chief Complaint Back Pain    HPI Denise Raymond is a 33 y.o. female who has a extensive back surgery history who comes in with 1 week of nausea, fatigue, body aches, diarrhea, vomiting, cough, runny nose, headache.  Patient was supposed to see her surgeon Dr. Macario Carls for her worsening lower back pain but given her symptoms she need to go get a coronavirus test prior to being seen. When she went to get test given her extensive back history she was told to come here.  Pt had post op infection that was based on wound culture not on MRI and was on vanc then dapto and taken off all antibiotics in Nov.  They were only able to figure out about the infection by trending crp/sed rate because that was only abnormality. She never had fever or white count elevation.  Her back pain is severe, constant, worse with moving and slightly better with home oxycodone.  No dysuria.  Did have episode of urinary incontinence  1 week ago.           Past Medical History:  Diagnosis Date   DJD (degenerative joint disease)    Kidney stone    Obese    PVC (premature ventricular contraction)    Scheuermann's kyphosis     There are no problems to display for this patient.   Past Surgical History:  Procedure Laterality Date   lap band sugery      Prior to Admission medications   Medication Sig Start Date End Date Taking? Authorizing Provider  baclofen (LIORESAL) 10 MG tablet Take 10 mg by mouth daily. 02/17/18   [provider]  diazepam (VALIUM) 5 MG tablet Take 1 tablet (5 mg total) by mouth every 8 (eight) hours as needed for muscle spasms. 04/23/19   Earleen Newport, MD  ESTARYLLA 0.25-35 MG-MCG tablet Take 1 tablet by  mouth as directed. 12/29/17   [provider]  gabapentin (NEURONTIN) 300 MG capsule Take 1 capsule (300 mg total) by mouth 3 (three) times daily for 10 days. 04/27/19 05/07/19  Menshew, Dannielle Karvonen, PA-C  metoprolol succinate (TOPROL-XL) 25 MG 24 hr tablet Take 1 tablet by mouth daily. 02/09/18   [provider]  nortriptyline (PAMELOR) 10 MG capsule Take 30 mg by mouth every evening. 02/21/18   [provider]  nystatin (MYCOSTATIN/NYSTOP) powder Apply topically 4 (four) times daily. 02/23/18   Carrie Mew, MD  ondansetron (ZOFRAN ODT) 4 MG disintegrating tablet Take 1 tablet (4 mg total) by mouth every 8 (eight) hours as needed for nausea or vomiting. 02/23/18   Carrie Mew, MD  ondansetron (ZOFRAN ODT) 4 MG disintegrating tablet Take 1 tablet (4 mg total) by mouth every 8 (eight) hours as needed for nausea or vomiting. 04/23/19   Earleen Newport, MD  rizatriptan (MAXALT-MLT) 10 MG disintegrating tablet Take 1-2 tablets by mouth daily as needed. 01/26/18   [provider]  sertraline (ZOLOFT) 100 MG tablet Take 100 mg by mouth daily. 12/07/17   [provider]    Allergies Bentyl [dicyclomine hcl], Daptomycin, Latex, Nsaids, and Reglan [metoclopramide]  History reviewed. No pertinent family history.  Social History Social History   Tobacco Use   Smoking status: Never Smoker   Smokeless  tobacco: Never Used  Substance Use Topics   Alcohol use: No   Drug use: No      Review of Systems Constitutional: + chills, body aches Eyes: No visual changes. ENT: No sore throat. Cardiovascular: Denies chest pain. Respiratory: Denies shortness of breath. Gastrointestinal: No abdominal pain.  No nausea, no vomiting.  No diarrhea.  No constipation. Genitourinary: Negative for dysuria. Musculoskeletal: + back pain Skin: Negative for rash. Neurological: + headache, focal weakness or numbness. All other ROS  negative ____________________________________________   PHYSICAL EXAM:  VITAL SIGNS: ED Triage Vitals  Enc Vitals Group     BP 09/14/19 1607 (!) 157/91     Pulse Rate 09/14/19 1607 82     Resp 09/14/19 1607 16     Temp 09/14/19 1607 98.2 F (36.8 C)     Temp Source 09/14/19 1607 Oral     SpO2 09/14/19 1607 100 %     Weight 09/14/19 1601 (!) 464 lb (210.5 kg)     Height 09/14/19 1601  (1.778 m)     Head Circumference --      Peak Flow --      Pain Score 09/14/19 1601 8     Pain Loc --      Pain Edu? --      Excl. in GC? --     Constitutional: Alert and oriented. Well appearing and in no acute distress overweight female  Eyes: Conjunctivae are normal. EOMI. Head: Atraumatic. Nose: No congestion/rhinnorhea. Mouth/Throat: Mucous membranes are moist.   Neck: No stridor. Trachea Midline. FROM Cardiovascular: Normal rate, regular rhythm. Grossly normal heart sounds.  Good peripheral circulation. Respiratory: Normal respiratory effort.  No retractions. Lungs CTAB. Gastrointestinal: Soft and nontender. No distention. No abdominal bruits.  Musculoskeletal: No lower extremity tenderness nor edema.  No joint effusions. Pain with moving either or her legs due to referred back pain. Tenderness more on the flank but surgical scar well healing.  Neurologic:  Normal speech and language. No gross focal neurologic deficits are appreciated.  Skin:  Skin is warm, dry and intact. No rash noted. Psychiatric: Mood and affect are normal. Speech and behavior are normal. GU: Deferred   ____________________________________________   LABS (all labs ordered are listed, but only abnormal results are displayed)  Labs Reviewed  COMPREHENSIVE METABOLIC PANEL - Abnormal; Notable for the following components:      Result Value   Glucose, Bld 102 (*)    Total Bilirubin 0.2 (*)    All other components within normal limits  URINALYSIS, ROUTINE W REFLEX MICROSCOPIC - Abnormal; Notable for the  following components:   Color, Urine YELLOW (*)    APPearance TURBID (*)    Hgb urine dipstick LARGE (*)    Protein, ur 30 (*)    Leukocytes,Ua SMALL (*)    RBC / HPF >50 (*)    Bacteria, UA FEW (*)    All other components within normal limits  SEDIMENTATION RATE - Abnormal; Notable for the following components:   Sed Rate 44 (*)    All other components within normal limits  RESPIRATORY PANEL BY RT PCR (FLU A&B, COVID)  URINE CULTURE  CBC WITH DIFFERENTIAL/PLATELET  PREGNANCY, URINE  C-REACTIVE PROTEIN  POC SARS CORONAVIRUS 2 AG -  ED  POC SARS CORONAVIRUS 2 AG   ____________________________________________    RADIOLOGY  Official radiology report(s): MR Lumbar Spine W Wo Contrast  Result Date: 09/14/2019 CLINICAL DATA:  Initial evaluation for acute back pain. EXAM: MRI LUMBAR SPINE  WITHOUT AND WITH CONTRAST TECHNIQUE: Multiplanar and multiecho pulse sequences of the lumbar spine were obtained without and with intravenous contrast. CONTRAST:  10mL GADAVIST GADOBUTROL 1 MMOL/ML IV SOLN COMPARISON:  Comparison made with prior CT from 03/31/2019. FINDINGS: Segmentation: Standard. Lowest well-formed disc space labeled the L5-S1 level. Alignment: Trace retrolisthesis of L5 on S1. Alignment otherwise normal with preservation of the normal lumbar lordosis. Vertebrae: Vertebral body height maintained without evidence for acute or chronic fracture. Bone marrow signal intensity diffusely decreased on T1 weighted imaging, nonspecific, but most commonly related to anemia, smoking, or obesity. No discrete or worrisome osseous lesions. Reactive endplate changes with mild endplate edema and enhancement seen about the L5-S1 interspace. Reactive marrow edema extends into the left L5 pedicle. No other abnormal marrow edema. No findings to suggest osteomyelitis discitis. Conus medullaris and cauda equina: Conus extends to the L1-2 level. Conus and cauda equina appear normal. No epidural collection or other  abnormality. Paraspinal and other soft tissues: Postoperative changes from prior left hemi laminectomy with micro discectomy at L5-S1. Normal expected postoperative changes seen about the laminectomy site without complication. No evidence for soft tissue infection or other abnormality. Visualized visceral structures are normal. Disc levels: T12-L1: Mild diffuse disc bulge.  No canal or foraminal stenosis. L1-2: Circumferential disc bulge with disc desiccation and intervertebral disc space narrowing. No significant canal or foraminal stenosis. No impingement. L2-3: Mild annular disc bulge. No significant canal or foraminal stenosis. L3-4: Circumferential disc bulge with disc desiccation and mild intervertebral disc space narrowing. Mild bilateral facet hypertrophy. Resultant mild spinal stenosis. Mild left L3 foraminal narrowing. No significant right foraminal encroachment. L4-5: Minimal annular disc bulge. Mild bilateral facet hypertrophy. No canal or lateral recess stenosis. Foramina remain patent. L5-S1: Trace retrolisthesis. Chronic intervertebral disc space narrowing with diffuse disc bulge and disc desiccation. Reactive endplate changes with marginal endplate osteophytic spurring. Postoperative changes from prior left hemi laminectomy with micro discectomy and partial facetectomy. Small central/left subarticular disc protrusion indents the left ventral thecal sac. Persistent moderate left with mild right lateral recess stenosis. Probable postoperative enhancing granulation tissue surrounds the descending left S1 nerve root. Central canal remains patent. Moderate left L5 foraminal stenosis. No significant right foraminal encroachment. IMPRESSION: 1. Postoperative changes from prior left hemi laminectomy with micro discectomy in facetectomy at L5-S1. No MRI evidence for postoperative infection or other complication. 2. Persistent small residual and/or recurrent central/left subarticular disc protrusion at L5-S1,  with persistent moderate left and mild right lateral recess stenosis. 3. Moderate left L5 foraminal stenosis related to disc bulge and reactive endplate changes. 4. Disc bulging with facet hypertrophy at L3-4 with resultant mild canal and left L3 foraminal stenosis. Electronically Signed   By: Rise MuBenjamin  McClintock M.D.   On: 09/14/2019 23:17   CT Renal Stone Study  Result Date: 09/15/2019 CLINICAL DATA:  Back pain. A cotton it has of stool in urine intermittently. EXAM: CT ABDOMEN AND PELVIS WITHOUT CONTRAST TECHNIQUE: Multidetector CT imaging of the abdomen and pelvis was performed following the standard protocol without IV contrast. COMPARISON:  CT dated April 23, 2019 FINDINGS: Lower chest: The lung bases are clear. The heart size is normal. Hepatobiliary: There is hepatomegaly versus a Riedel's lobe. Status post cholecystectomy.There is no biliary ductal dilation. Pancreas: Normal contours without ductal dilatation. No peripancreatic fluid collection. Spleen: The spleen is borderline enlarged measuring approximately 12 cm. Adrenals/Urinary Tract: --Adrenal glands: No adrenal hemorrhage. --Right kidney/ureter: There is hyperdense material within the collecting system favored to represent excreted contrast. --  Left kidney/ureter: There is hyperdense material within the collecting system favored to represent excreted contrast --Urinary bladder: Contrast is noted within the urinary bladder. Stomach/Bowel: --Stomach/Duodenum: There is a lap band in place. --Small bowel: There is evidence of stasis of several small bowel loops an the left mid abdomen. The small bowel loops measure up to approximately 3.2 cm in diameter. Some of the small bowel loops demonstrate fecalization. There is no clear transition point or evidence for small bowel obstruction --Colon: No focal abnormality. --Appendix: Normal. Vascular/Lymphatic: Normal course and caliber of the major abdominal vessels. There is a circumaortic left renal vein,  a normal variant. --No retroperitoneal lymphadenopathy. --No mesenteric lymphadenopathy. --No pelvic or inguinal lymphadenopathy. Reproductive: Unremarkable Other: No ascites or free air. The abdominal wall is normal. Musculoskeletal. No acute displaced fractures. IMPRESSION: 1. No acute abnormality. 2. Hepatosplenomegaly. 3. Status post lap band placement. Electronically Signed   By: Katherine Mantle M.D.   On: 09/15/2019 00:35    ____________________________________________   PROCEDURES  Procedure(s) performed (including Critical Care):  Procedures   ____________________________________________   INITIAL IMPRESSION / ASSESSMENT AND PLAN / ED COURSE  Clarivel Callaway was evaluated in Emergency Department on 09/14/2019 for the symptoms described in the history of present illness. She was evaluated in the context of the global COVID-19 pandemic, which necessitated consideration that the patient might be at risk for infection with the SARS-CoV-2 virus that causes COVID-19. Institutional protocols and algorithms that pertain to the evaluation of patients at risk for COVID-19 are in a state of rapid change based on information released by regulatory bodies including the CDC and federal and state organizations. These policies and algorithms were followed during the patient's care in the ED.    Pt is 33 yr old with multiple viral sounding symptoms. Will test for covid. Concern for cord compression given worsening back pain and episode of urinary incontinence and prior infections in back. Will get MRI to evaluate.  Will also trend out crp/set rate as well. Not fever and no white count unlikely bacteremic.   MRI negative acute process  SED rate up to 40s and last check at [facility] was 13.  Pt concerned this is sign of the infection coming back in her back.  I offered transfer to [facility] for ortho evaluation and she does not want to be transferred.  She will call her ID doc and Ortho doc tomorrow.  She is  adamant that she does not want to have to be transferred and pt is well appearing and not septic and this has probably been progressing over the past few days I think that is reasonable.   Urine >50 RBCs will get CT renal to make sure no evidence ofkidney stone given pain in flank.  Pt handed off pending CT renal but if negative pt would like to be discharged home to f/u with her docs.  ____________________________________________   FINAL CLINICAL IMPRESSION(S) / ED DIAGNOSES   Final diagnoses:  Acute low back pain without sciatica, unspecified back pain laterality  Elevated sed rate  Chills      MEDICATIONS GIVEN DURING THIS VISIT:  Medications  HYDROmorphone (DILAUDID) injection 1 mg (1 mg Intravenous Given 09/14/19 2115)  ondansetron (ZOFRAN) injection 4 mg (4 mg Intravenous Given 09/14/19 2115)  LORazepam (ATIVAN) injection 1 mg (1 mg Intravenous Given 09/14/19 2130)  gadobutrol (GADAVIST) 1 MMOL/ML injection 10 mL (10 mLs Intravenous Contrast Given 09/14/19 2200)  sodium chloride 0.9 % bolus 1,000 mL (1,000 mLs Intravenous New Bag/Given  09/14/19 2244)  HYDROmorphone (DILAUDID) injection 2 mg (2 mg Intravenous Given 09/14/19 2244)  HYDROmorphone (DILAUDID) injection 2 mg (2 mg Intravenous Given 09/15/19 0001)     ED Discharge Orders    None       Note:  This document was prepared using Dragon voice recognition software and may include unintentional dictation errors.   Concha Se, MD 09/15/19 [phone]

## 2019-09-14 NOTE — ED Notes (Signed)
Pt offered medication for pain located in the acute pain protocol, pt states has taken stronger today with no relief and will wait.

## 2019-09-15 LAB — C-REACTIVE PROTEIN: CRP: 4.5 mg/dL — ABNORMAL HIGH (ref <1.0)

## 2019-09-15 MED ORDER — HYDROMORPHONE HCL 1 MG/ML IJ SOLN
1.0000 mg | Freq: Once | INTRAMUSCULAR | Status: AC
  Administered 2019-09-15: 02:00:00 1 mg via INTRAVENOUS
  Filled 2019-09-15: qty 1

## 2019-09-15 MED ORDER — PROMETHAZINE HCL 25 MG PO TABS: 25.0000 mg | ORAL_TABLET | Freq: Four times a day (QID) | ORAL | 0 refills | Status: DC | PRN

## 2019-09-15 NOTE — ED Provider Notes (Signed)
-----------------------------------------   1:45 AM on 09/15/2019 -----------------------------------------  CT renal colic study interpreted per Dr. Nyoka Cowden: 1. No acute abnormality.  2. Hepatosplenomegaly.  3. Status post lap band placement.   Updated patient of CT renal colic study.  Will discharge home per previous provider's plan.  Patient asking for prescription for Phenergan.  Also asking for additional dose of analgesia prior to discharge.  Strict return precautions given.  Patient verbalizes understanding and agrees with plan of care.   Paulette Blanch, MD 09/15/19 [phone]

## 2019-09-15 NOTE — ED Notes (Signed)
PT visualized in bed playing on phone in NAD. Respirations even and unlabored.

## 2019-09-15 NOTE — Discharge Instructions (Addendum)
Please continue your medicines daily as directed by your doctor.  You may take Phenergan as needed for nausea.  Call your surgeon today for a close follow-up appointment.  Return to the ER for worsening symptoms, persistent vomiting, difficulty breathing or other concerns.

## 2019-09-16 LAB — URINE CULTURE: Culture: 70000 — AB

## 2020-09-17 ENCOUNTER — Other Ambulatory Visit: Payer: Self-pay | Admitting: Rheumatology

## 2020-09-17 DIAGNOSIS — M2550 Pain in unspecified joint: Secondary | ICD-10-CM

## 2020-10-01 ENCOUNTER — Ambulatory Visit: Payer: Managed Care, Other (non HMO)

## 2020-10-01 ENCOUNTER — Other Ambulatory Visit: Payer: Managed Care, Other (non HMO)

## 2021-04-28 IMAGING — CT CT LUMBAR SPINE WITHOUT CONTRAST
3 of 4 series · 10 of 33 positions shown, 12 images · non-contrast
Comparison: CT abdomen pelvis 04/23/2019

CLINICAL DATA: Back pain 1 week.  Abnormal x-ray

EXAM:
CT LUMBAR SPINE WITHOUT CONTRAST
TECHNIQUE: Multidetector CT imaging of the lumbar spine was performed without
intravenous contrast administration. Multiplanar CT image
reconstructions were also generated.

[Series 7: sagittal bone · sagittal · 0.36mm/px · 5 of 81 slices shown, 6 images]
[im 27/81  bone]
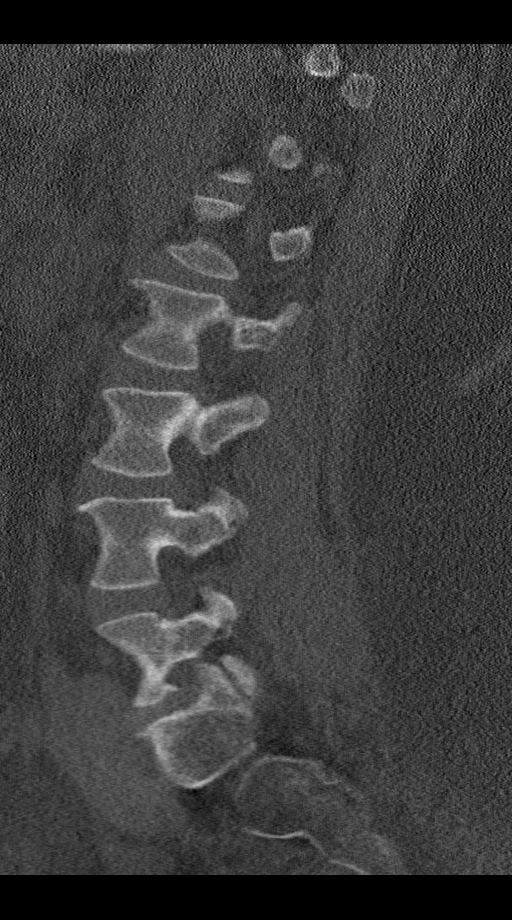
[im 34/81  bone]
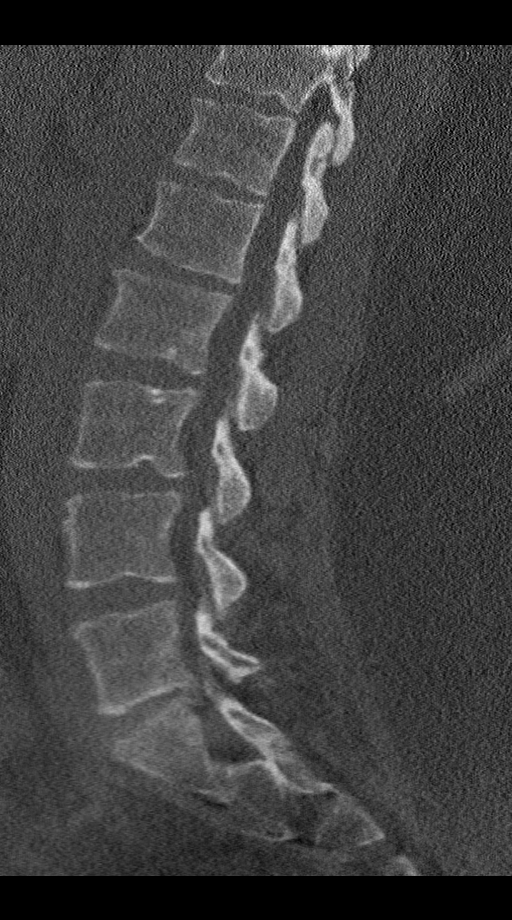
[im 41/81  soft-tissue]
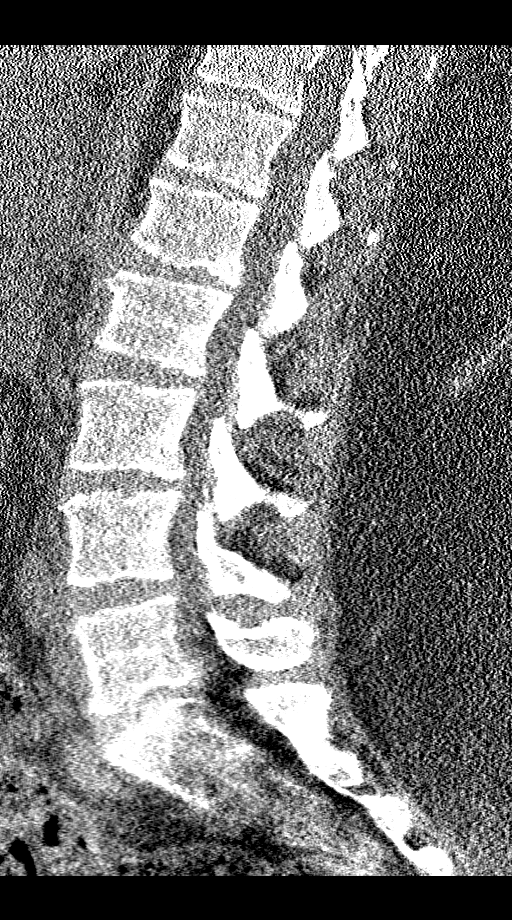
[im 41/81  bone]
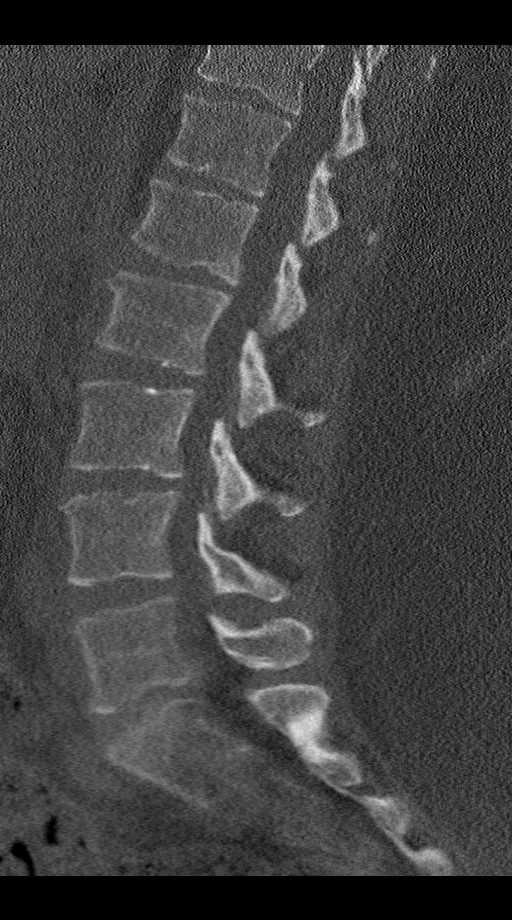
[im 47/81  bone]
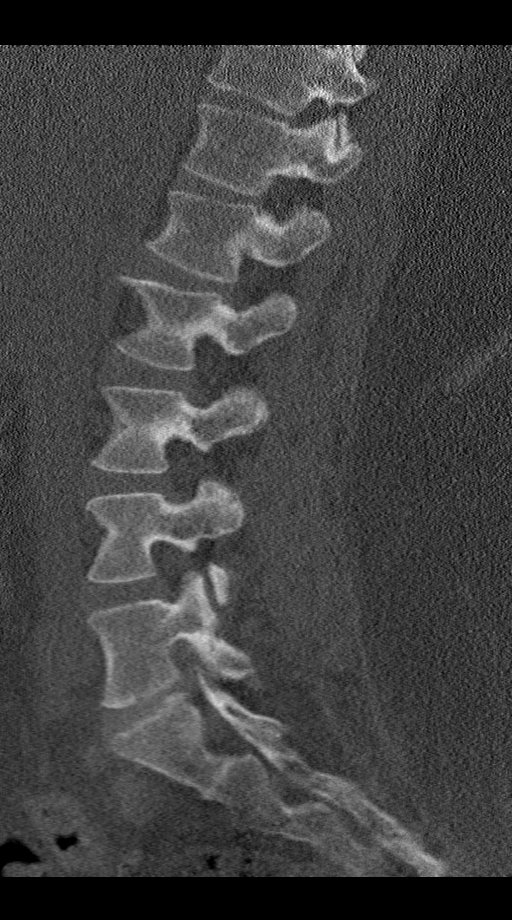
[im 54/81  bone]
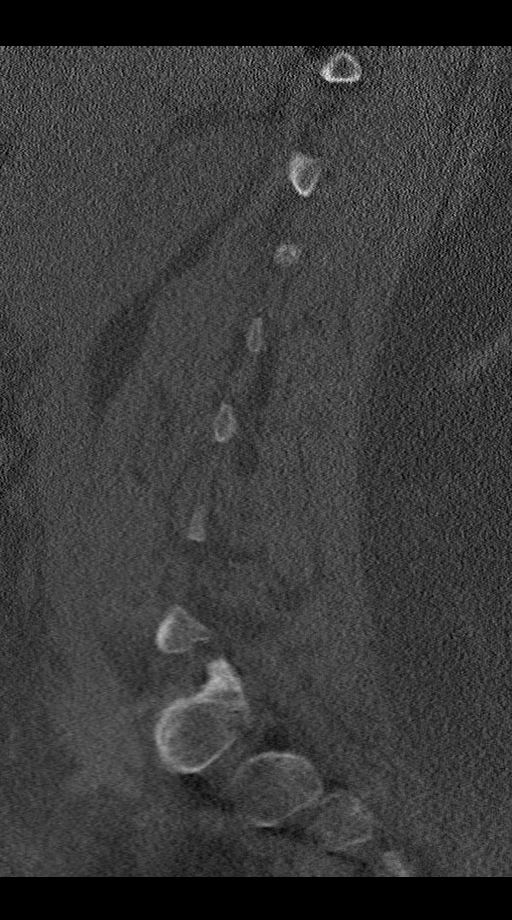

[Series 8: coronal bone · coronal · 0.34mm/px · 3 of 89 slices shown]
[im 18/89  bone]
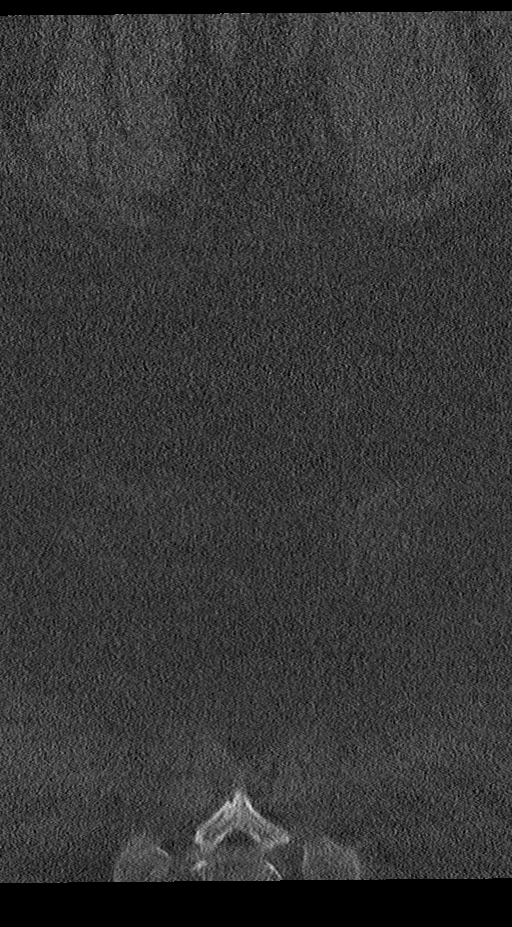
[im 36/89  bone]
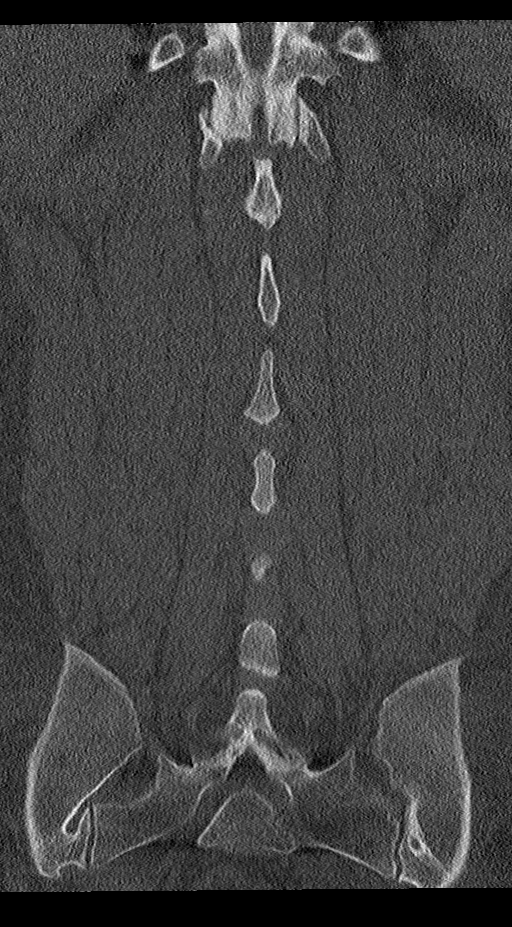
[im 53/89  bone]
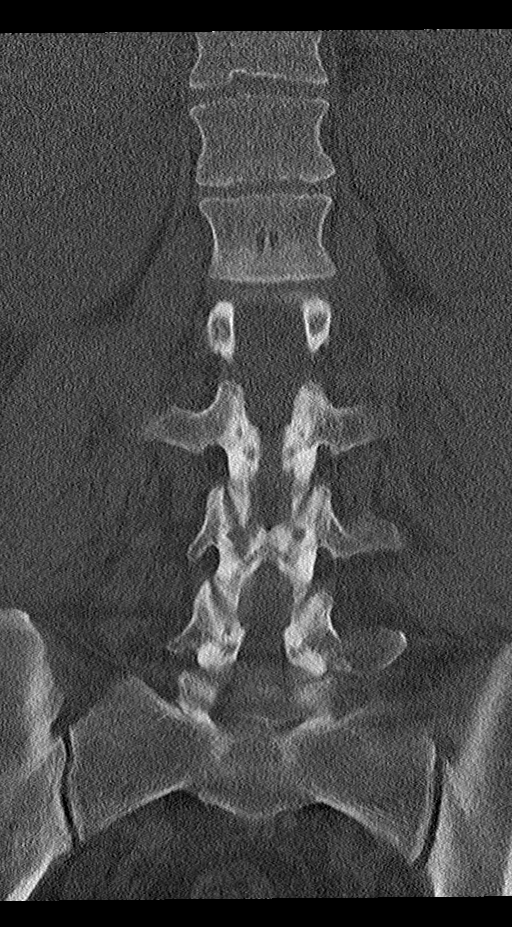

[Series 9: multi disc · axial · 0.21mm/px · z∈[-57,+43]mm · 2 of 143 slices shown, 3 images]
[im 48/143  soft-tissue]
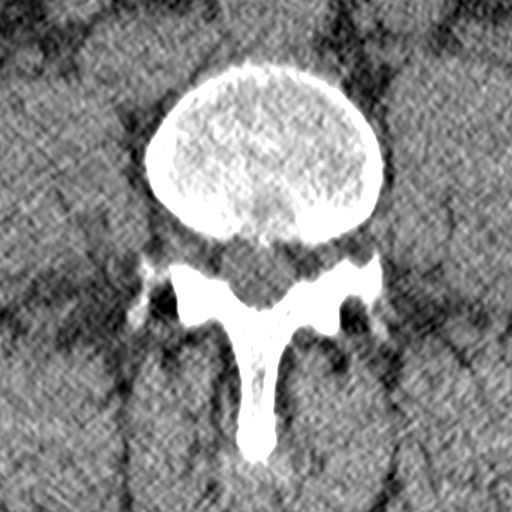
[im 48/143  bone]
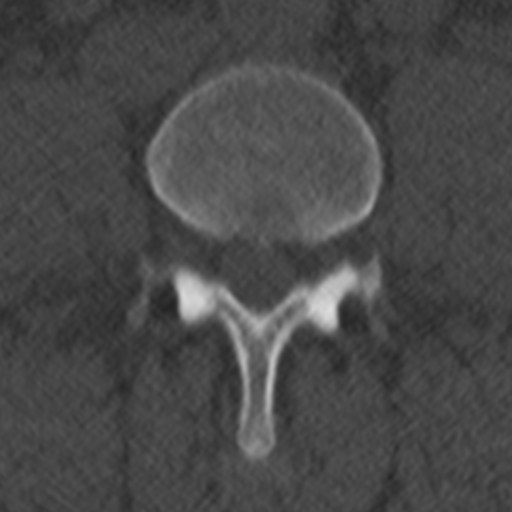
[im 95/143  bone]
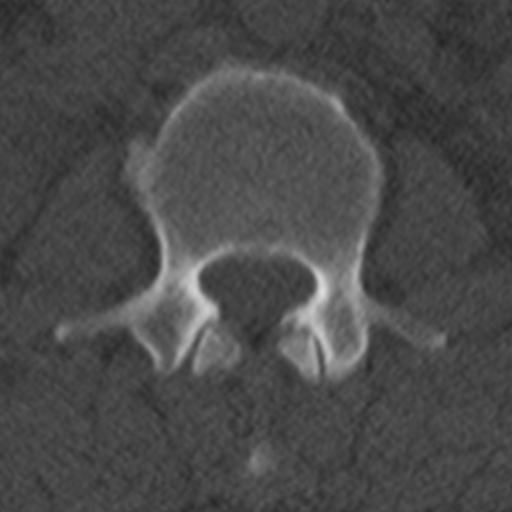

[10 of 33 positions shown; findings below may reference images not displayed]

FINDINGS: Segmentation: Normal

Alignment: Normal

Vertebrae: Negative for fracture or mass. No acute bony abnormality.

Paraspinal and other soft tissues: Negative for paraspinous soft
tissue mass or edema.

Disc levels: T12-L1: Mild disc degeneration with Schmorl's node.
Negative for stenosis

L1-2: Mild disc bulging and Schmorl's node.  Negative for stenosis

L2-3: Mild disc degeneration and Schmorl's node. Negative for disc
protrusion or stenosis

L3-4: Diffuse bulging of the disc and mild facet degeneration. Mild
spinal stenosis.

L4-5: Mild disc and mild facet degeneration. Negative for spinal
stenosis. Neural foramina widely patent

L5-S1: Mild disc degeneration. Asymmetric endplate spurring on the
left. Mild facet degeneration. Subarticular stenosis on the left due
to spurring could affect the left S1 nerve root.
IMPRESSION: Multilevel degenerative change in the lumbar spine. No significant
spinal stenosis

Subarticular stenosis on the left L5-S1 due to spurring. This could
affect the left S1 nerve root.

## 2021-12-08 ENCOUNTER — Other Ambulatory Visit: Payer: Self-pay | Admitting: Obstetrics and Gynecology

## 2022-01-01 ENCOUNTER — Encounter
Admission: RE | Admit: 2022-01-01 | Discharge: 2022-01-01 | Disposition: A | Discharge status: Home / Self Care | Payer: Commercial Managed Care - PPO | Source: Ambulatory Visit | Attending: Obstetrics and Gynecology | Admitting: Obstetrics and Gynecology

## 2022-01-01 VITALS — Ht 70.0 in | Wt >= 6400 oz

## 2022-01-01 DIAGNOSIS — Z01812 Encounter for preprocedural laboratory examination: Secondary | ICD-10-CM

## 2022-01-01 DIAGNOSIS — I2510 Atherosclerotic heart disease of native coronary artery without angina pectoris: Secondary | ICD-10-CM

## 2022-01-01 HISTORY — DX: Palpitations: R00.2

## 2022-01-01 HISTORY — DX: Morbid (severe) obesity due to excess calories: E66.01

## 2022-01-01 HISTORY — DX: Depression, unspecified: F32.A

## 2022-01-01 HISTORY — DX: Other chronic pain: G89.29

## 2022-01-01 HISTORY — DX: Unintended awareness under general anesthesia during procedure, initial encounter: T88.53XA

## 2022-01-01 HISTORY — DX: Chronic kidney disease, unspecified: N18.9

## 2022-01-01 HISTORY — DX: Cardiac arrhythmia, unspecified: I49.9

## 2022-01-01 HISTORY — DX: Anxiety disorder, unspecified: F41.9

## 2022-01-01 HISTORY — DX: Discitis, unspecified, lumbar region: M46.46

## 2022-01-01 HISTORY — DX: Headache, unspecified: R51.9

## 2022-01-01 HISTORY — DX: Personal history of other diseases of the musculoskeletal system and connective tissue: Z87.39

## 2022-01-01 NOTE — Patient Instructions (Addendum)
Your procedure is scheduled on: 01/08/2022 ?Report to the Registration Desk on the 1st floor of the [facility]. ?To find out your arrival time, please call [phone] between 1PM - 3PM on: 01/07/2022 ? ?REMEMBER: ?Instructions that are not followed completely may result in serious medical risk, up to and including death; or upon the discretion of your surgeon and anesthesiologist your surgery may need to be rescheduled. ? ?Do not eat food after midnight the night before surgery.  ?No gum chewing, lozengers or hard candies. ? ?You may however, drink water up to 2 hours before you are scheduled to arrive for your surgery. Do not drink anything within 2 hours of your scheduled procedure.  ? ?TAKE THESE MEDICATIONS THE MORNING OF SURGERY WITH A SIP OF WATER: ?- tiaGABine (GABITRIL) 4 MG  ?-  ?-  ? ? ?One week prior to surgery: ?Stop Anti-inflammatories (NSAIDS) such as Advil, Aleve, Ibuprofen, Motrin, Naproxen, Naprosyn and Aspirin based products such as Excedrin, Goodys Powder, BC Powder. You may however, continue to take Tylenol if needed for pain up until the day of surgery. ? ?Stop any over the counter vitamins and supplements until after surgery. ? ? ?No Alcohol for 24 hours before or after surgery. ? ?No Smoking including e-cigarettes for 24 hours prior to surgery.  ?No chewable tobacco products for at least 6 hours prior to surgery.  ?No nicotine patches on the day of surgery. ? ?Do not use any "recreational" drugs for at least a week prior to your surgery.  ?Please be advised that the combination of cocaine and anesthesia may have negative outcomes, up to and including death. ?If you test positive for cocaine, your surgery will be cancelled. ? ?On the morning of surgery brush your teeth with toothpaste and water, you may rinse your mouth with mouthwash if you wish. ?Do not swallow any toothpaste or mouthwash. ? ?Do not wear jewelry, make-up, hairpins, clips or nail polish. ? ?Do not wear lotions, powders,  or perfumes.  ? ?Do not shave body from the neck down 48 hours prior to surgery just in case you cut yourself which could leave a site for infection.  ? ?Do not bring valuables to the hospital. [facility] is not responsible for any missing/lost belongings or valuables.  ? ?Notify your doctor if there is any change in your medical condition (cold, fever, infection). ? ?Wear comfortable clothing (specific to your surgery type) to the hospital. ? ?If you are being admitted to the hospital overnight, leave your suitcase in the car. ?After surgery it may be brought to your room. ? ?If you are being discharged the day of surgery, you will not be allowed to drive home. ?You will need a responsible adult (18 years or older) to drive you home and stay with you that night.  ? ?If you are taking public transportation, you will need to have a responsible adult (18 years or older) with you. ?Please confirm with your physician that it is acceptable to use public transportation.  ? ?Please call the [place] [facility] at [phone] if you have any questions about these instructions. ? ?Surgery Visitation Policy: ? ?Patients undergoing a surgery or procedure may have one family member or support person in the surgical waiting area with them as long as that person is not COVID-19 positive or experiencing its symptoms.  ?The patient is not allowed to have any visitors in the pre-operative area. Visitors must remain in the surgical waiting room.  ? ?Inpatient  Visitation:   ? ?Visiting hours are 7 a.m. to 8 p.m. ?Up to two visitors ages 16+ are allowed at one time in a patient room. The visitors may rotate out with other people during the day. Visitors must check out when they leave, or other visitors will not be allowed. One designated support person may remain overnight. ?The visitor must pass COVID-19 screenings, use hand sanitizer when entering and exiting the patient?s room and wear a mask at all times, including in  the patient?s room. ?Patients must also wear a mask when staff or their visitor are in the room. ?Masking is required regardless of vaccination status.   ?

## 2022-01-01 NOTE — H&P (Signed)
Preoperative History and Physical ?  ?Denise Raymond is a 36 y.o. [phone] here for surgical management of menorrhagia with irregular cycle.   No significant preoperative concerns. ?  ?History of Present Illness: 36 y.o. [id number] who is s/p vaginal delivery in 4/56/2563, complicated by postpartum hemorrhage.  She did not require a transfusion.  She breast fed for about 2 months.  She had the regular postpartum bleeding.  The bleeding got heavier, then slacked off, then really heavy, then light again.  The bleeding did stop and now comes irregularly.  She started having some brown discharge with small clots. This started in late October time frame.  She tried provera, which stopped the bleeding.  This was in October.  She had a pelvic ultrasound on 10/31, which was normal and showed an endometrial stripe of 8 mm.  Prior to her most recent pregnancy, her periods were very regular.  She is not on contraception at this point.  She was on Ortho-Cyclen, which she started 8 weeks postpartum and she took it until about late January.  There seems to be no rhyme or reason to the bleeding.  She has continued having the brown discharge even since stopping her birth control pills recently.  She denies pain.  She has been nauseated more than usual and has been taking Zofran for this.   ?Her ultrasound showed a normal right ovary. The left ovary was not able to be visualized.   ?  ?She does have a history of migraine with aura (sometimes).  ?  ?There is a strong family history of breast cancer (her grandmother and several of her siblings). Her mother has never had breast cancer.   ?  ?She lost weight during her pregnancy (about 20 pounds).  Since then she's maintained. She has no concerns regarding STIs.  She denies other vaginal symptoms. She had a UTI in December.   ?  ?With G1 she bled for about 1.5 weeks and she was done. This one has been much harder.  ?  ?She was started on norethindrone (47m) with which she had side effects  (headhaces, hot flashes, facial redness).   ?She tried a coarse of doxycycline for presumed chronic endometritis.  ?  ?Given the continued bleeding, especially with darker (black) color, she would like a hysteroscopy, dilation and curettage. ?  ?Proposed surgery: hysteroscopy, dilation and curettage. ?  ?    ?Past Medical History:  ?Diagnosis Date  ? Anesthesia complication    ?  Quick metabolizer of "caine" family   ? Anxiety    ? Awareness under anesthesia    ?  MAC-EGD   ? Chronic kidney disease 09/2016  ? Chronic pain    ? Depression    ? Discitis of lumbar region 07/04/2019  ? Gallbladder disease    ? H/O degenerative disc disease    ? History of headache    ? Kidney stone 09/2016  ? Low back pain    ? Maternal iron deficiency anemia affecting pregnancy in third trimester, antepartum 03/30/2017  ? Obesity, morbid (CMS-HCC)    ? Palpitations    ? Pregnancy-induced hypertension in third trimester 05/06/2017  ? Transient alteration of awareness    ?  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  ?     ?Past Surgical History:  ?Pro[place]  [place]   [place]  ? PR REMOVAL GALLBLADDER   07/2002  ? LAPAROSCOPIC REMOVAL  REPLACEMENT/REVISION GASTRIC BANDING PORT   2013  ? [place] [facility]GERY   2014  ? MICRODISCECTOMY TUBULAR W/ROOT DECOMP N/A 05/24/2019  ?  Procedure: MICRODISCECTOMY TUBULAR W/ROOT DECOMP MEDS L5-S1;  Surgeon: Myrene Buddy, Osvaldo Human, MD;  Location: [facility];  Service: Orthopedics;  Laterality: N/A;  ? MICROSURGERY N/A 05/24/2019  ?  Procedure: MICROSURGICAL TECHNIQUES, REQUIRING USE OF OPERATING MICROSCOPE (LIST IN ADDITION TO PRIMARY PROCEDURE);  Surgeon: Myrene Buddy, Osvaldo Human, MD;  Location: [facility];  Service: Orthopedics;  Laterality: N/A;  ? MICRODISCECTOMY TUBULAR W/ROOT DECOMP Left 06/19/2019  ?  Procedure: MICRODISCECTOMY TUBULAR W/ROOT DECOMP MEDS L5-S1;  Surgeon: Myrene Buddy, Osvaldo Human, MD;  Location: [facility];  Service: Orthopedics;  Laterality: Left;  ? MICROSURGERY Left 06/19/2019  ?  Procedure: MICROSURGICAL TECHNIQUES, REQUIRING USE OF OPERATING MICROSCOPE (LIST IN ADDITION TO PRIMARY PROCEDURE);  Surgeon: Myrene Buddy, Osvaldo Human, MD;  Location: [facility];  Service: Orthopedics;  Laterality: Left;  ? EXPLORATORY LAPAROTOMY      ? LAMINECTOMY      ? POSTERIOR LAMINECTOMY / DECOMPRESSION CERVICAL SPINE      ?  ?                 ?OB History  ?Gravida Para Term Preterm AB Living  ?_0 0 0 2  ?SAB IAB Ectopic Molar Multiple Live Births   ?0 0 0 0 0 2  ?   ?# Outcome Date GA Lbr Len/2nd Weight Sex Delivery Anes PTL Lv  ?2 Term 04/24/21 [redacted]w[redacted]d/ 00:35 3.52 kg (7 lb 12.2 oz) F Vag-Spont EPI   LIV  ?1 Term 05/11/17 3107w5d2:30 / 00:42 3.165 kg (6 lb 15.6 oz) F Vag-Spont EPI   LIV  ?Patient denies any other pertinent gynecologic issues.  ?  ?      ?Current Outpatient Medications on File Prior to Visit  ?Medication Sig Dispense Refill  ? ascorbic acid, vitamin C, (VITAMIN C) 500 MG tablet Take 500 mg by mouth nightly         ? aspirin 81 MG EC tablet Take 81 mg by mouth once daily      ? buPROPion (WELLBUTRIN XL) 150 MG XL tablet Take 1 tablet (150 mg total) by mouth once daily 30 tablet 11  ? Compound Medication Lidocaine 4% nasal spray: once spray every 2 hours as needed for migraine. Dispense 15 ml. 15 each 3  ? cyanocobalamin (VITAMIN B12) 1000 MCG tablet Take 1,000 mcg by mouth once daily      ? folic acid (FOLVITE) 40983CG tablet Take 400 mcg by mouth nightly         ? HYDROcodone-acetaminophen (NORCO) 5-325 mg tablet 1 po q 8 prns prn pain 15 tablet 0  ? ibuprofen (MOTRIN) 800 MG tablet Take 1 tablet (800 mg total) by mouth every 8 (eight) hours as needed for Pain 30 tablet 1  ? Lactobac no.41/Bifidobact no.7 (PROBIOTIC-10 ORAL) Take by mouth      ? MAGNESIUM ORAL Take by mouth      ? melatonin 3 mg tablet Take 1 tablet (3 mg total) by mouth once daily 30 tablet  6  ? norethindrone (AYGESTIN) 5 mg tablet Take 1 tablet (5 mg total) by mouth once daily 90 tablet 1  ? nortriptyline (PAMELOR) 50 MG capsule Take 1 capsule (50 mg total) by mouth at bedtime 30 capsule 2  ? prenatal vit-iron fum-folic ac (PRENAVITE) tablet Take 1 tablet by mouth once  daily      ? riboflavin, vitamin B2, (VITAMIN B-2 ORAL) Take by mouth      ? sertraline (ZOLOFT) 100 MG tablet Take 1 tablet (100 mg total) by mouth once daily 60 tablet 2  ? SUMAtriptan (IMITREX) 100 MG tablet Take 1 tablet (100 mg total) by mouth as directed for Migraine May take a second dose after 2 hours if needed. Do not take more than 2 doses per day. Do not take more than 2 days per week. 10 tablet 5  ? tiaGABine (GABITRIL) 4 MG tablet Take by mouth      ?  ?No current facility-administered medications on file prior to visit.  ?  ?     ?Allergies  ?Allergen Reactions  ? Bentyl [Dicyclomine] Hallucination  ? Compazine [Prochlorperazine] Other (See Comments)  ?    Jittery, restless, flushing  ? Daptomycin Muscle Pain  ?    Patient reported severe muscle pain in chest, back, extremities after 3 doses of daptomycin 1250-1500 mg IV q24h (~7-10 mg/kg)  ? Latex Hives  ? Lidoderm [Lidocaine] Other (See Comments)  ?    Patient states she gets blisters on her skin  ? Nsaids (Non-Steroidal Anti-Inflammatory Drug) Unknown  ?    Pt was told not to take because she has a LAPBAND  ? Reglan [Metoclopramide Hcl] Hallucination  ?  ?  ?Social History:   reports that she has never smoked. She has never used smokeless tobacco. She reports that she does not drink alcohol and does not use drugs. ?  ?     ?Family History  ?Problem Relation Age of Onset  ? High blood pressure (Hypertension) Mother    ? Heart disease Mother    ?      cause of death  ? Hyperlipidemia (Elevated cholesterol) Mother    ?      diagnosed in late 47s  ? Diabetes type II Mother    ? Depression Mother    ? Osteoporosis (Thinning of bones) Mother    ? Anesthesia problems Mother     ?      N/V   ? Diabetes Mother    ?      diagnosed in late 91s  ? Gout Mother    ? High blood pressure (Hypertension) Maternal Grandfather    ? Hyperlipidemia (Elevated cholesterol) Maternal Grandfather

## 2022-01-07 MED ORDER — ORAL CARE MOUTH RINSE: 15.0000 mL | Freq: Once | OROMUCOSAL | Status: AC

## 2022-01-07 MED ORDER — CHLORHEXIDINE GLUCONATE 0.12 % MT SOLN: 15.0000 mL | Freq: Once | OROMUCOSAL | Status: AC

## 2022-01-07 MED ORDER — LACTATED RINGERS IV SOLN: INTRAVENOUS | Status: DC

## 2022-01-07 MED ORDER — FAMOTIDINE 20 MG PO TABS: 20.0000 mg | ORAL_TABLET | Freq: Once | ORAL | Status: AC

## 2022-01-08 ENCOUNTER — Ambulatory Visit
Admission: RE | Admit: 2022-01-08 | Discharge: 2022-01-08 | Disposition: A | Discharge status: Home / Self Care | Payer: Commercial Managed Care - PPO | Attending: Obstetrics and Gynecology | Admitting: Obstetrics and Gynecology

## 2022-01-08 ENCOUNTER — Ambulatory Visit: Payer: Commercial Managed Care - PPO | Admitting: Certified Registered Nurse Anesthetist

## 2022-01-08 ENCOUNTER — Other Ambulatory Visit: Payer: Self-pay

## 2022-01-08 ENCOUNTER — Encounter
Admission: RE | Disposition: A | Discharge status: Home / Self Care | Payer: Self-pay | Source: Home / Self Care | Attending: Obstetrics and Gynecology

## 2022-01-08 ENCOUNTER — Encounter: Payer: Self-pay | Admitting: Obstetrics and Gynecology

## 2022-01-08 DIAGNOSIS — Z803 Family history of malignant neoplasm of breast: Secondary | ICD-10-CM | POA: Insufficient documentation

## 2022-01-08 DIAGNOSIS — Z6841 Body Mass Index (BMI) 40.0 and over, adult: Secondary | ICD-10-CM | POA: Diagnosis not present

## 2022-01-08 DIAGNOSIS — N711 Chronic inflammatory disease of uterus: Secondary | ICD-10-CM | POA: Insufficient documentation

## 2022-01-08 DIAGNOSIS — N921 Excessive and frequent menstruation with irregular cycle: Secondary | ICD-10-CM | POA: Diagnosis not present

## 2022-01-08 DIAGNOSIS — H548 Legal blindness, as defined in USA: Secondary | ICD-10-CM | POA: Insufficient documentation

## 2022-01-08 DIAGNOSIS — Z01810 Encounter for preprocedural cardiovascular examination: Secondary | ICD-10-CM | POA: Diagnosis not present

## 2022-01-08 DIAGNOSIS — F32A Depression, unspecified: Secondary | ICD-10-CM | POA: Insufficient documentation

## 2022-01-08 DIAGNOSIS — R110 Nausea: Secondary | ICD-10-CM | POA: Insufficient documentation

## 2022-01-08 DIAGNOSIS — F419 Anxiety disorder, unspecified: Secondary | ICD-10-CM | POA: Diagnosis not present

## 2022-01-08 DIAGNOSIS — I493 Ventricular premature depolarization: Secondary | ICD-10-CM | POA: Diagnosis not present

## 2022-01-08 HISTORY — PX: HYSTEROSCOPY WITH D & C: SHX1775

## 2022-01-08 LAB — CBC
HCT: 36.7 % (ref 36.0–46.0)
Hemoglobin: 11.5 g/dL — ABNORMAL LOW (ref 12.0–15.0)
MCH: 24.9 pg — ABNORMAL LOW (ref 26.0–34.0)
MCHC: 31.3 g/dL (ref 30.0–36.0)
MCV: 79.4 fL — ABNORMAL LOW (ref 80.0–100.0)
Platelets: 350 10*3/uL (ref 150–400)
RBC: 4.62 MIL/uL (ref 3.87–5.11)
RDW: 13.6 % (ref 11.5–15.5)
WBC: 6.8 10*3/uL (ref 4.0–10.5)
nRBC: 0 % (ref 0.0–0.2)

## 2022-01-08 LAB — BASIC METABOLIC PANEL
Anion gap: 7 (ref 5–15)
BUN: 13 mg/dL (ref 6–20)
CO2: 23 mmol/L (ref 22–32)
Calcium: 8.9 mg/dL (ref 8.9–10.3)
Chloride: 103 mmol/L (ref 98–111)
Creatinine, Ser: 0.76 mg/dL (ref 0.44–1.00)
GFR, Estimated: >60 mL/min (ref >60)
Glucose, Bld: 98 mg/dL (ref 70–99)
Potassium: 3.9 mmol/L (ref 3.5–5.1)
Sodium: 133 mmol/L — ABNORMAL LOW (ref 135–145)

## 2022-01-08 LAB — ABO/RH: ABO/RH(D): A NEG

## 2022-01-08 LAB — TYPE AND SCREEN
ABO/RH(D): A NEG
Antibody Screen: NEGATIVE

## 2022-01-08 LAB — POCT PREGNANCY, URINE: Preg Test, Ur: NEGATIVE

## 2022-01-08 SURGERY — DILATATION AND CURETTAGE /HYSTEROSCOPY
Anesthesia: General | Site: Uterus

## 2022-01-08 MED ORDER — ACETAMINOPHEN 10 MG/ML IV SOLN
INTRAVENOUS | Status: AC
  Filled 2022-01-08: qty 100

## 2022-01-08 MED ORDER — SILVER NITRATE-POT NITRATE 75-25 % EX MISC
CUTANEOUS | Status: DC | PRN
  Administered 2022-01-08: 2

## 2022-01-08 MED ORDER — MIDAZOLAM HCL 2 MG/2ML IJ SOLN
INTRAMUSCULAR | Status: DC | PRN
  Administered 2022-01-08: 2 mg via INTRAVENOUS

## 2022-01-08 MED ORDER — ACETAMINOPHEN 10 MG/ML IV SOLN: 1000.0000 mg | Freq: Once | INTRAVENOUS | Status: DC | PRN

## 2022-01-08 MED ORDER — ONDANSETRON HCL 4 MG/2ML IJ SOLN
INTRAMUSCULAR | Status: DC | PRN
  Administered 2022-01-08: 4 mg via INTRAVENOUS

## 2022-01-08 MED ORDER — ACETAMINOPHEN 10 MG/ML IV SOLN
INTRAVENOUS | Status: DC | PRN
  Administered 2022-01-08: 1000 mg via INTRAVENOUS

## 2022-01-08 MED ORDER — SUCCINYLCHOLINE CHLORIDE 200 MG/10ML IV SOSY
PREFILLED_SYRINGE | INTRAVENOUS | Status: DC | PRN
  Administered 2022-01-08: 160 mg via INTRAVENOUS

## 2022-01-08 MED ORDER — FENTANYL CITRATE (PF) 100 MCG/2ML IJ SOLN: 25.0000 ug | INTRAMUSCULAR | Status: DC | PRN

## 2022-01-08 MED ORDER — FAMOTIDINE 20 MG PO TABS
ORAL_TABLET | ORAL | Status: AC
  Administered 2022-01-08: 20 mg via ORAL
  Filled 2022-01-08: qty 1

## 2022-01-08 MED ORDER — DROPERIDOL 2.5 MG/ML IJ SOLN: 0.6250 mg | Freq: Once | INTRAMUSCULAR | Status: DC | PRN

## 2022-01-08 MED ORDER — LACTATED RINGERS IV SOLN: INTRAVENOUS | Status: DC | PRN

## 2022-01-08 MED ORDER — TRAMADOL HCL 50 MG PO TABS: 50.0000 mg | ORAL_TABLET | Freq: Three times a day (TID) | ORAL | 0 refills | Status: AC | PRN

## 2022-01-08 MED ORDER — PROPOFOL 10 MG/ML IV BOLUS
INTRAVENOUS | Status: AC
  Filled 2022-01-08: qty 20

## 2022-01-08 MED ORDER — LIDOCAINE HCL (CARDIAC) PF 100 MG/5ML IV SOSY
PREFILLED_SYRINGE | INTRAVENOUS | Status: DC | PRN
  Administered 2022-01-08: 50 mg via INTRAVENOUS

## 2022-01-08 MED ORDER — OXYCODONE HCL 5 MG/5ML PO SOLN: 5.0000 mg | Freq: Once | ORAL | Status: DC | PRN

## 2022-01-08 MED ORDER — FENTANYL CITRATE (PF) 100 MCG/2ML IJ SOLN
INTRAMUSCULAR | Status: DC | PRN
Start: 2022-01-08 — End: 2022-01-08
  Administered 2022-01-08 (×2): 50 ug via INTRAVENOUS

## 2022-01-08 MED ORDER — FENTANYL CITRATE (PF) 100 MCG/2ML IJ SOLN
INTRAMUSCULAR | Status: AC
  Filled 2022-01-08: qty 2

## 2022-01-08 MED ORDER — SODIUM CHLORIDE 0.9 % IR SOLN
Status: DC | PRN
Start: 2022-01-08 — End: 2022-01-08
  Administered 2022-01-08: 3000 mL

## 2022-01-08 MED ORDER — OXYCODONE HCL 5 MG PO TABS: 5.0000 mg | ORAL_TABLET | Freq: Once | ORAL | Status: DC | PRN

## 2022-01-08 MED ORDER — PROPOFOL 10 MG/ML IV BOLUS
INTRAVENOUS | Status: DC | PRN
  Administered 2022-01-08: 200 mg via INTRAVENOUS

## 2022-01-08 MED ORDER — MIDAZOLAM HCL 2 MG/2ML IJ SOLN
INTRAMUSCULAR | Status: AC
Start: 2022-01-08 — End: ?
  Filled 2022-01-08: qty 2

## 2022-01-08 MED ORDER — DEXAMETHASONE SODIUM PHOSPHATE 10 MG/ML IJ SOLN
INTRAMUSCULAR | Status: DC | PRN
  Administered 2022-01-08: 10 mg via INTRAVENOUS

## 2022-01-08 MED ORDER — ROCURONIUM BROMIDE 100 MG/10ML IV SOLN
INTRAVENOUS | Status: DC | PRN
  Administered 2022-01-08: 20 mg via INTRAVENOUS

## 2022-01-08 MED ORDER — CHLORHEXIDINE GLUCONATE 0.12 % MT SOLN
OROMUCOSAL | Status: AC
  Administered 2022-01-08: 15 mL via OROMUCOSAL
  Filled 2022-01-08: qty 15

## 2022-01-08 MED ORDER — SUGAMMADEX SODIUM 200 MG/2ML IV SOLN
INTRAVENOUS | Status: DC | PRN
  Administered 2022-01-08: 426.4 mg via INTRAVENOUS

## 2022-01-08 SURGICAL SUPPLY — 28 items
BACTOSHIELD CHG 4% 4OZ (MISCELLANEOUS) ×1
BAG DRN RND TRDRP ANRFLXCHMBR (UROLOGICAL SUPPLIES)
BAG URINE DRAIN 2000ML AR STRL (UROLOGICAL SUPPLIES) IMPLANT
CATH FOLEY 2WAY  5CC 16FR (CATHETERS)
CATH FOLEY 2WAY 5CC 16FR (CATHETERS)
CATH URTH 16FR FL 2W BLN LF (CATHETERS) IMPLANT
DEVICE MYOSURE LITE (MISCELLANEOUS) ×1 IMPLANT
DEVICE MYOSURE REACH (MISCELLANEOUS) ×1 IMPLANT
DRSG TELFA 3X8 NADH (GAUZE/BANDAGES/DRESSINGS) ×2 IMPLANT
ELECT REM PT RETURN 9FT ADLT (ELECTROSURGICAL) ×2
ELECTRODE REM PT RTRN 9FT ADLT (ELECTROSURGICAL) ×1 IMPLANT
GLOVE BIO SURGEON STRL SZ7 (GLOVE) ×2 IMPLANT
GLOVE SURG UNDER POLY LF SZ7.5 (GLOVE) ×2 IMPLANT
GOWN STRL REUS W/ TWL LRG LVL3 (GOWN DISPOSABLE) ×2 IMPLANT
GOWN STRL REUS W/TWL LRG LVL3 (GOWN DISPOSABLE) ×4
IV NS IRRIG 3000ML ARTHROMATIC (IV SOLUTION) ×2 IMPLANT
KIT PROCEDURE FLUENT (KITS) ×2 IMPLANT
KIT TURNOVER CYSTO (KITS) ×2 IMPLANT
MANIFOLD NEPTUNE II (INSTRUMENTS) ×1 IMPLANT
PACK DNC HYST (MISCELLANEOUS) ×2 IMPLANT
PAD DRESSING TELFA 3X8 NADH (GAUZE/BANDAGES/DRESSINGS) IMPLANT
PAD OB MATERNITY 4.3X12.25 (PERSONAL CARE ITEMS) ×2 IMPLANT
PAD PREP 24X41 OB/GYN DISP (PERSONAL CARE ITEMS) ×2 IMPLANT
SCRUB CHG 4% DYNA-HEX 4OZ (MISCELLANEOUS) ×1 IMPLANT
SEAL ROD LENS SCOPE MYOSURE (ABLATOR) ×2 IMPLANT
SET CYSTO W/LG BORE CLAMP LF (SET/KITS/TRAYS/PACK) IMPLANT
SURGILUBE 2OZ TUBE FLIPTOP (MISCELLANEOUS) ×2 IMPLANT
TUBING CONNECTING 10 (TUBING) ×2 IMPLANT

## 2022-01-08 NOTE — Anesthesia Postprocedure Evaluation (Signed)
Anesthesia Post Note ? ?Patient: Denise Raymond ? ?Procedure(s) Performed: DILATATION AND CURETTAGE /HYSTEROSCOPY (Uterus) ? ?Patient location during evaluation: PACU ?Anesthesia Type: General ?Level of consciousness: awake and alert ?Pain management: pain level controlled ?Vital Signs Assessment: post-procedure vital signs reviewed and stable ?Respiratory status: spontaneous breathing, nonlabored ventilation and respiratory function stable ?Cardiovascular status: blood pressure returned to baseline and stable ?Postop Assessment: no apparent nausea or vomiting ?Anesthetic complications: no ? ? ?No notable events documented. ? ? ?Last Vitals:  ?Vitals:  ? 01/08/22 1200 01/08/22 1225  ?BP: (!) 147/85 (!) 152/85  ?Pulse: 81 87  ?Resp: 18 18  ?Temp: 36.6 ?C   ?SpO2: 98% 100%  ?  ?Last Pain:  ?Vitals:  ? 01/08/22 1225  ?TempSrc:   ?PainSc: 0-No pain  ? ? ?  ?  ?  ?  ?  ?  ? ?Foye Deer ? ? ? ? ?

## 2022-01-08 NOTE — Discharge Instructions (Signed)

## 2022-01-08 NOTE — Transfer of Care (Signed)
Immediate Anesthesia Transfer of Care Note ? ?Patient: Denise Raymond ? ?Procedure(s) Performed: DILATATION AND CURETTAGE /HYSTEROSCOPY (Uterus) ? ?Patient Location: PACU ? ?Anesthesia Type:General ? ?Level of Consciousness: awake, alert  and oriented ? ?Airway & Oxygen Therapy: Patient Spontanous Breathing and Patient connected to face mask oxygen ? ?Post-op Assessment: Report given to RN and Post -op Vital signs reviewed and stable ? ?Post vital signs: Reviewed and stable ? ?Last Vitals:  ?Vitals Value Taken Time  ?BP 137/80 01/08/22 1130  ?Temp    ?Pulse 87 01/08/22 1132  ?Resp 23 01/08/22 1132  ?SpO2 100 % 01/08/22 1132  ?Vitals shown include unvalidated device data. ? ?Last Pain:  ?Vitals:  ? 01/08/22 0950  ?TempSrc: Oral  ?PainSc: 0-No pain  ?   ? ?  ? ?Complications: No notable events documented. ?

## 2022-01-08 NOTE — Op Note (Signed)
Operative Note  ? ?Date of Service: 01/08/2022  ?DOB: 1986-07-03  ?MRN: [id number]  ? ?PRE-OP DIAGNOSIS: Menorrhagia with irregular cycle [N92.1] ?  ?POST-OP DIAGNOSIS: Menorrhagia with irregular cycle [N92.1]  ? ?SURGEON: Surgeon(s) and Role: ?   Will Bonnet, MD - Primary ? ?PROCEDURE: Procedure(s): ?DILATATION AND CURETTAGE /HYSTEROSCOPY  ? ?ANESTHESIA: Choice  ? ?ESTIMATED BLOOD LOSS: minimal ? ?DRAINS: none  ? ?TOTAL IV FLUIDS: 800 mL ? ?SPECIMENS:  ?1) Endometrial curettings ?2) Endocervical curettings ? ?VTE PROPHYLAXIS: SCDs to the bilateral lower extremities ? ?ANTIBIOTICS: None indicated ? ?FLUID DEFICIT: 40 mL ? ?COMPLICATIONS: none ? ?DISPOSITION: PACU - hemodynamically stable. ? ?CONDITION: stable ? ?INDICATION: 36 y.o. G2P2 female who is postpartum as of July 2022. She has experienced erratic bleeding since that time and has tried multiple treatments to eradicate the bleeding without success. With no explanation for the bleeding and failed treatments, a mutual decision was made to proceed to the operating room for a hysteroscopy, dilation and curettage.  ? ?FINDINGS: Exam under anesthesia revealed small, mobile anteverted uterus with no masses and bilateral adnexa without masses or fullness. Hysteroscopy revealed a grossly normal appearing uterine cavity with bilateral tubal ostia and normal appearing endocervical canal. ? ?PROCEDURE IN DETAIL:  After informed consent was obtained, the patient was taken to the operating room where anesthesia was obtained without difficulty. The patient was positioned in the dorsal lithotomy position in [place].  The patient's bladder was catheterized with an in and out foley catheter.  The patient was examined under anesthesia, with the above noted findings.  The bi-valved speculum was placed inside the patient's vagina, and the the anterior lip of the cervix was grasped with the tenaculum.  The cervix was progressively dilated to a 7 mm Hegar dilator.   The hysteroscope was introduced, with the above noted findings. ? ?The hystersocope was removed and the uterine cavity was curetted until a gritty texture was noted, yielding moderate endometrial curettings.  An endocervical curettage was performed, at this point.  The hysteroscope was re-introduced to verify hemostasis and no perforations to the uterus. Once it was verified that no perforation were present and hemostasis was present the hysteroscope was removed. ? ?The single-tooth tenaculum was  removed and with silver nitrate application, hemostasis was noted at the tenaculum entry sites.  The vagina was verified to be free of instruments and sponges and the speculum was removed. She was then taken out of dorsal lithotomy. ? ?The patient tolerated the procedure well.  Sponge, lap and needle counts were correct x2.  The patient was taken to recovery room in excellent condition. ? ?Will Bonnet, MD, FACOG ?01/08/2022 11:15 AM  ? ?

## 2022-01-08 NOTE — Anesthesia Preprocedure Evaluation (Addendum)
Anesthesia Evaluation  ?Patient identified by MRN, date of birth, ID band ?Patient awake ? ? ? ?Reviewed: ?Allergy & Precautions, NPO status , Patient's Chart, lab work & pertinent test results ? ?History of Anesthesia Complications ?(+) history of anesthetic complications (Quick metabolizer of "caine" family) ? ?Airway ?Mallampati: II ? ?TM Distance: >3 FB ?Neck ROM: full ? ? ? Dental ?no notable dental hx. ? ?  ?Pulmonary ?neg pulmonary ROS,  ?  ?Pulmonary exam normal ? ? ? ? ? ? ? Cardiovascular ?Normal cardiovascular exam+ dysrhythmias (PVC)  ? ?ECHO 2022: ?NORMAL LEFT VENTRICULAR SYSTOLIC FUNCTION WITH MILD LVH  ???NORMAL LA PRESSURES WITH NORMAL DIASTOLIC FUNCTION  ???NORMAL RIGHT VENTRICULAR SYSTOLIC FUNCTION  ???VALVULAR REGURGITATION: TRIVIAL PR, TRIVIAL TR  ???NO VALVULAR STENOSIS  ? ?  ?Neuro/Psych ? Headaches, PSYCHIATRIC DISORDERS Anxiety Depression Legally blind, uses contacts ?  ? GI/Hepatic ?negative GI ROS, Neg liver ROS,   ?Endo/Other  ?Morbid obesity ? Renal/GU ?  ? ?  ?Musculoskeletal ? ? Abdominal ?(+) + obese,   ?Peds ? 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 ?Past Surgical History: ?2014: BARIATRIC SURGERY ?2003: CHOLECYSTECTOMY ?No date: EXPLORATORY LAPAROTOMY ?No date: LAMINECTOMY ?No date: lap band sugery ?2020: MICRODISCECTOMY LUMBAR ?    Comment:  Surgeon: Myrene Buddy ?No date: POSTERIOR LAMINECTOMY / DECOMPRESSION CERVICAL  SPINE ?1996: TONSILLECTOMY AND ADENOIDECTOMY ? ? ? ? Reproductive/Obstetrics ?negative OB ROS ? ?  ? ? ? ? ? ? ? ? ? ? ? ? ? ?  ?  ? ? ? ? ? ? ? ?Anesthesia Physical ?Anesthesia Plan ? ?ASA: 3 ? ?Anesthesia Plan: General ETT  ? ?Post-op Pain Management: Minimal or no pain anticipated and Tylenol PO (pre-op)*  ? ?Induction: Intravenous ? ?PONV Risk Score and Plan: Ondansetron, Dexamethasone, Midazolam and Treatment may vary due to age or medical condition ? ?Airway Management Planned: Oral ETT ? ?Additional Equipment:  ? ?Intra-op Plan:  ? ?Post-operative Plan: Extubation in OR ? ?Informed Consent: I have reviewed the patients History and Physical, chart, labs and discussed the procedure including the risks, benefits and alternatives for the proposed anesthesia with the patient or authorized representative who has indicated his/her understanding and acceptance.  ? ? ? ?Dental advisory given ? ?Plan Discussed with: Anesthesiologist, CRNA and Surgeon ? ?Anesthesia Plan Comments:   ? ? ? ? ? ?Anesthesia Quick Evaluation ? ?

## 2022-01-08 NOTE — Interval H&P Note (Signed)
History and Physical Interval Note: ? ?01/08/2022 ?9:36 AM ? ?Denise Raymond  has presented today for surgery, with the diagnosis of abnormal uterine bleeding.  The various methods of treatment have been discussed with the patient and family. After consideration of risks, benefits and other options for treatment, the patient has consented to  Procedure(s): ?DILATATION AND CURETTAGE /HYSTEROSCOPY (N/A) as a surgical intervention.  The patient's history has been reviewed, patient examined, no change in status, stable for surgery.  I have reviewed the patient's chart and labs.  Questions were answered to the patient's satisfaction.  Consents reviewed. Patient agrees to proceed. ? ?Prentice Docker, MD, FACOG ?[facility] ?01/08/2022 9:37 AM   ?

## 2022-01-08 NOTE — Anesthesia Procedure Notes (Signed)
Procedure Name: Intubation ?Date/Time: 01/08/2022 10:37 AM ?Performed by: Hermenia Bers, CRNA ?Pre-anesthesia Checklist: Patient identified, Patient being monitored, Timeout performed, Emergency Drugs available and Suction available ?Patient Re-evaluated:Patient Re-evaluated prior to induction ?Oxygen Delivery Method: Circle system utilized ?Preoxygenation: Pre-oxygenation with 100% oxygen ?Induction Type: IV induction ?Ventilation: Mask ventilation without difficulty ?Laryngoscope Size: 3 and McGraph ?Grade View: Grade I ?Tube type: Oral ?Tube size: 7.0 mm ?Number of attempts: 1 ?Airway Equipment and Method: Stylet ?Placement Confirmation: ETT inserted through vocal cords under direct vision, positive ETCO2 and breath sounds checked- equal and bilateral ?Secured at: 22 cm ?Tube secured with: Tape ?Dental Injury: Teeth and Oropharynx as per pre-operative assessment  ? ? ? ? ?

## 2022-01-09 ENCOUNTER — Encounter: Payer: Self-pay | Admitting: Obstetrics and Gynecology

## 2022-01-12 LAB — SURGICAL PATHOLOGY

## 2022-02-13 ENCOUNTER — Encounter: Payer: Self-pay | Admitting: Internal Medicine

## 2022-02-13 ENCOUNTER — Other Ambulatory Visit: Payer: Self-pay

## 2022-02-13 ENCOUNTER — Other Ambulatory Visit (HOSPITAL_COMMUNITY)
Admission: RE | Admit: 2022-02-13 | Discharge: 2022-02-13 | Disposition: A | Discharge status: Home / Self Care | Payer: Commercial Managed Care - PPO | Source: Ambulatory Visit | Attending: Internal Medicine | Admitting: Internal Medicine

## 2022-02-13 ENCOUNTER — Ambulatory Visit (INDEPENDENT_AMBULATORY_CARE_PROVIDER_SITE_OTHER): Payer: Commercial Managed Care - PPO | Admitting: Internal Medicine

## 2022-02-13 VITALS — BP 148/87 | HR 81 | Temp 97.9°F | Wt >= 6400 oz

## 2022-02-13 DIAGNOSIS — N719 Inflammatory disease of uterus, unspecified: Secondary | ICD-10-CM

## 2022-02-13 NOTE — Progress Notes (Signed)
Patient Active Problem List   Diagnosis Date Noted   Menorrhagia with irregular cycle 01/08/2022    Patient's Medications  New Prescriptions   No medications on file  Previous Medications   BUPROPION (WELLBUTRIN XL) 300 MG 24 HR TABLET    Take 300 mg by mouth daily.   IBUPROFEN (ADVIL) 800 MG TABLET    Take 800 mg by mouth 2 (two) times daily as needed for mild pain.   ONDANSETRON (ZOFRAN ODT) 4 MG DISINTEGRATING TABLET    Take 1 tablet (4 mg total) by mouth every 8 (eight) hours as needed for nausea or vomiting.   SERTRALINE (ZOLOFT) 100 MG TABLET    Take 150 mg by mouth daily.   SUMATRIPTAN (IMITREX) 100 MG TABLET    Take 100 mg by mouth every 2 (two) hours as needed for migraine. May repeat in 2 hours if headache persists or recurs.   TIAGABINE (GABITRIL) 4 MG TABLET    Take 8 mg by mouth 2 (two) times daily.   TRAMADOL (ULTRAM) 50 MG TABLET    Take 1 tablet (50 mg total) by mouth every 8 (eight) hours as needed.  Modified Medications   No medications on file  Discontinued Medications   No medications on file    Subjective:  35 YF referred by Ob for management of purulence form vagina. She had NSVD in July 2022 c/b hemmhorahe and postpartum bleeding. She underwent D&C due to black drianage to stop. She noted purulence from vagina 5 days post-op: Pt completed Augmentin x  weeks , without improvement. Last does was last firday night. Denies fever, chill, V, D. She does have constipation.  Referred to ID for further management. Occupation:Gyn/Onc nurse.  Review of Systems: Review of Systems  All other systems reviewed and are negative.  Past Medical History:  Diagnosis Date   Anxiety    Awareness under anesthesia    Chronic pain    CKD (chronic kidney disease)    Depression    Discitis of lumbar region    DJD (degenerative joint disease)    Dysrhythmia    H/O degenerative disc disease    Headache    Kidney stone    Kidney stone    Morbid obesity (HCC)     Palpitations    PVC (premature ventricular contraction)    Scheuermann's kyphosis     Social History   Tobacco Use   Smoking status: Never   Smokeless tobacco: Never  Vaping Use   Vaping Use: Never used  Substance Use Topics   Alcohol use: No   Drug use: No    No family history on file.  Allergies  Allergen Reactions   Compazine [Prochlorperazine] Other (See Comments)    Suicidal ideation   Reglan [Metoclopramide] Other (See Comments)    Suicidal Ideation   Bentyl [Dicyclomine Hcl] Other (See Comments)    "Makes me feel like I'm drunk"   Latex Hives   Nsaids Other (See Comments)    Has gastric band   Daptomycin Other (See Comments)    Muscle pain   Lidoderm [Lidocaine] Rash    Health Maintenance  Topic Date Due   HIV Screening  Never done   Hepatitis C Screening  Never done   PAP SMEAR-Modifier  Never done   TETANUS/TDAP  01/25/2010   COVID-19 Vaccine (3 - Pfizer risk series) 12/26/2019   INFLUENZA VACCINE  04/28/2022   HPV VACCINES  Aged Out  Objective:  Vitals:   02/13/22 1515  BP: (!) 148/87  Pulse: 81  Temp: 97.9 F (36.6 C)  TempSrc: Temporal  Weight: (!) 484 lb (219.5 kg)   Body mass index is 69.45 kg/m.  Physical Exam Constitutional:      Appearance: Normal appearance.  HENT:     Head: Normocephalic and atraumatic.     Right Ear: Tympanic membrane normal.     Left Ear: Tympanic membrane normal.     Nose: Nose normal.     Mouth/Throat:     Mouth: Mucous membranes are moist.  Eyes:     Extraocular Movements: Extraocular movements intact.     Conjunctiva/sclera: Conjunctivae normal.     Pupils: Pupils are equal, round, and reactive to light.  Cardiovascular:     Rate and Rhythm: Normal rate and regular rhythm.     Heart sounds: No murmur heard.   No friction rub. No gallop.  Pulmonary:     Effort: Pulmonary effort is normal.     Breath sounds: Normal breath sounds.  Abdominal:     General: Abdomen is flat.     Palpations:  Abdomen is soft.  Musculoskeletal:        General: Normal range of motion.  Skin:    General: Skin is warm and dry.  Neurological:     General: No focal deficit present.     Mental Status: She is alert and oriented to person, place, and time.  Psychiatric:        Mood and Affect: Mood normal.    Lab Results Lab Results  Component Value Date   WBC 6.8 01/08/2022   HGB 11.5 (L) 01/08/2022   HCT 36.7 01/08/2022   MCV 79.4 (L) 01/08/2022   PLT 350 01/08/2022    Lab Results  Component Value Date   CREATININE 0.76 01/08/2022   BUN 13 01/08/2022   NA 133 (L) 01/08/2022   K 3.9 01/08/2022   CL 103 01/08/2022   CO2 23 01/08/2022    Lab Results  Component Value Date   ALT 19 09/14/2019   AST 19 09/14/2019   ALKPHOS 89 09/14/2019   BILITOT 0.2 (L) 09/14/2019    No results found for: CHOL, HDL, LDLCALC, LDLDIRECT, TRIG, CHOLHDL No results found for: LABRPR, RPRTITER No results found for: HIV1RNAQUANT, HIV1RNAVL, CD4TABS   A/P 36 YO G2P2002 F with Endometritis SP vaginal delivery c/b PP hemorrhage -SP hysteroscopy, D&C on 4/13 for post partum endometritis refractory to doxycyline. She then received  Augmentin for about  2 weeks. Referred to ID for "more pus" through vagina.  -She continues to note purulence passing from vagina, no fever, chills, dysuria. Her last imaging was transvaginal ultrasound on 10/22. I will get CT AP to look for foci of infection. Will order wet prep/UA to look for other sources.  -Follow-up wit OB/gyn  for transvaginal ultrasound to see if repeat D&C needed -Follow-up 1 month   Laurice Record, MD [facility] 02/13/2022, 3:22 PM

## 2022-02-14 LAB — URINALYSIS, ROUTINE W REFLEX MICROSCOPIC
Bilirubin Urine: NEGATIVE
Glucose, UA: NEGATIVE
Hgb urine dipstick: NEGATIVE
Ketones, ur: NEGATIVE
Leukocytes,Ua: NEGATIVE
Nitrite: NEGATIVE
Protein, ur: NEGATIVE
Specific Gravity, Urine: 1.011 (ref 1.001–1.035)
pH: 5.5 (ref 5.0–8.0)

## 2022-02-16 LAB — CERVICOVAGINAL ANCILLARY ONLY
Bacterial Vaginitis (gardnerella): POSITIVE — AB
Chlamydia: NEGATIVE
Comment: NEGATIVE
Comment: NEGATIVE
Comment: NEGATIVE
Comment: NORMAL
Neisseria Gonorrhea: NEGATIVE
Trichomonas: NEGATIVE

## 2022-02-16 LAB — HIV ANTIBODY (ROUTINE TESTING W REFLEX): HIV 1&2 Ab, 4th Generation: NONREACTIVE

## 2022-02-16 LAB — URINE CYTOLOGY ANCILLARY ONLY
Chlamydia: NEGATIVE
Comment: NEGATIVE
Comment: NORMAL
Neisseria Gonorrhea: NEGATIVE

## 2022-02-18 ENCOUNTER — Encounter: Payer: Self-pay | Admitting: Internal Medicine

## 2022-02-20 ENCOUNTER — Ambulatory Visit (HOSPITAL_COMMUNITY)
Admission: RE | Admit: 2022-02-20 | Discharge: 2022-02-20 | Disposition: A | Discharge status: Home / Self Care | Payer: Commercial Managed Care - PPO | Source: Ambulatory Visit | Attending: Internal Medicine | Admitting: Internal Medicine

## 2022-02-20 DIAGNOSIS — N719 Inflammatory disease of uterus, unspecified: Secondary | ICD-10-CM | POA: Insufficient documentation

## 2022-02-20 MED ORDER — IOHEXOL 350 MG/ML SOLN
100.0000 mL | Freq: Once | INTRAVENOUS | Status: AC | PRN
Start: 2022-02-20 — End: 2022-02-20
  Administered 2022-02-20: 100 mL via INTRAVENOUS

## 2022-02-24 MED ORDER — METRONIDAZOLE 500 MG PO TABS: 500.0000 mg | ORAL_TABLET | Freq: Two times a day (BID) | ORAL | 0 refills | Status: AC

## 2022-03-09 NOTE — Telephone Encounter (Signed)
Please see the MyChart message reply(ies) for my assessment and plan.    This patient gave consent for this Medical Advice Message and is aware that it may result in a bill to [employer], as well as the possibility of receiving a bill for a co-payment or deductible. They are an established patient, but are not seeking medical advice exclusively about a problem treated during an in person or video visit in the last seven days. I did not recommend an in person or video visit within seven days of my reply.    I spent a total of 8 minutes cumulative time within 7 days through [employer].  Danelle Earthly, MD

## 2022-03-11 ENCOUNTER — Telehealth (INDEPENDENT_AMBULATORY_CARE_PROVIDER_SITE_OTHER): Payer: Commercial Managed Care - PPO | Admitting: Internal Medicine

## 2022-03-11 ENCOUNTER — Other Ambulatory Visit: Payer: Self-pay

## 2022-03-11 DIAGNOSIS — N898 Other specified noninflammatory disorders of vagina: Secondary | ICD-10-CM | POA: Diagnosis not present

## 2022-03-11 NOTE — Progress Notes (Signed)
Subjective:    Patient ID: Denise Raymond, female    DOB: [birth year], 36 y.o.   MRN: [id number]  No chief complaint on file.    Virtual Visit via Telephone/Video Note   I connected withNAME@ on 03/12/2022 at 4:17 PM by telephone and verified that I am speaking with the correct person using two identifiers.   I discussed the limitations, risks, security and privacy concerns of performing an evaluation and management service by telephone and the availability of in person appointments. I also discussed with the patient that there may be a patient responsible charge related to this service. The patient expressed understanding and agreed to proceed.  Location:  Patient: Home Provider: [facility]   HPI:  34 YF referred by Ob for management of purulence form vagina. She had NSVD in July 2022 c/b hemmhorahe and postpartum bleeding. She underwent D&C due to black drianage to stop. She noted purulence from vagina 5 days post-op: Pt completed Augmentin x  weeks , without improvement. Last does was last firday night. Denies fever, chill, V, D. She does have constipation.  Referred to ID for further management. Occupation:Gyn/Onc nurse.   Today 03/12/22: Pt reprots she still has brown discharge every time she wipes. Denies fevers and chills. She has not gotten transvag US yet.    Allergies  Allergen Reactions   Compazine [Prochlorperazine] Other (See Comments)    Suicidal ideation   Reglan [Metoclopramide] Other (See Comments)    Suicidal Ideation   Bentyl [Dicyclomine Hcl] Other (See Comments)    "Makes me feel like I'm drunk"   Latex Hives   Nsaids Other (See Comments)    Has gastric band   Daptomycin Other (See Comments)    Muscle pain   Lidoderm [Lidocaine] Rash      Outpatient Medications Prior to Visit  Medication Sig Dispense Refill   buPROPion (WELLBUTRIN XL) 300 MG 24 hr tablet Take 300 mg by mouth daily.     ibuprofen (ADVIL) 800 MG tablet Take 800 mg by mouth 2 (two)  times daily as needed for mild pain.     ondansetron (ZOFRAN ODT) 4 MG disintegrating tablet Take 1 tablet (4 mg total) by mouth every 8 (eight) hours as needed for nausea or vomiting. 20 tablet 0   sertraline (ZOLOFT) 100 MG tablet Take 150 mg by mouth daily.     SUMAtriptan (IMITREX) 100 MG tablet Take 100 mg by mouth every 2 (two) hours as needed for migraine. May repeat in 2 hours if headache persists or recurs.     tiaGABine (GABITRIL) 4 MG tablet Take 8 mg by mouth 2 (two) times daily.     traMADol (ULTRAM) 50 MG tablet Take 1 tablet (50 mg total) by mouth every 8 (eight) hours as needed. 5 tablet 0   No facility-administered medications prior to visit.     Past Medical History:  Diagnosis Date   Anxiety    Awareness under anesthesia    Chronic pain    CKD (chronic kidney disease)    Depression    Discitis of lumbar region    DJD (degenerative joint disease)    Dysrhythmia    H/O degenerative disc disease    Headache    Kidney stone    Kidney stone    Morbid obesity (HCC)    Palpitations    PVC (premature ventricular contraction)    Scheuermann's kyphosis      Past Surgical History:  Procedure Laterality Date   BARIATRIC SURGERY  2014   CHOLECYSTECTOMY  2003   EXPLORATORY LAPAROTOMY     HYSTEROSCOPY WITH D & C N/A 01/08/2022   Procedure: DILATATION AND CURETTAGE /HYSTEROSCOPY;  Surgeon: Conard Novak, MD;  Location: [facility];  Service: Gynecology;  Laterality: N/A;   LAMINECTOMY     lap band sugery     MICRODISCECTOMY LUMBAR  2020   Surgeon: Meyer Cory   POSTERIOR LAMINECTOMY / DECOMPRESSION CERVICAL SPINE     TONSILLECTOMY AND ADENOIDECTOMY  1996       Review of Systems    Objective:    Nursing note and vital signs reviewed.     Assessment & Plan:  A/P 36 YO G2P2002 F with Endometritis SP vaginal delivery c/b PP hemorrhage -SP hysteroscopy, D&C on 4/13 for post partum endometritis refractory to doxycyline. She then received  Augmentin for  about  2 weeks. Referred to ID for "more pus" through vagina.  -She continues to note purulence passing from vagina, no fever, chills, dysuria. Her last imaging was transvaginal ultrasound on 10/22.  -Pt reprots conitnued brown dishcarge every time she wipes, she is tired and can't sleep as much.  - CT AP did not show foci of infection -Studies positive for BV SP metronidazole x 7 days Plan -Follow-up wit OB/gyn  for transvaginal ultrasound to see if repeat D&C needed -Discussed that the etiology of hepatosplenomagaly is braod. She plans on getting CMP and lipid panel with PCP. Would recommending checking Hep panel as well -Follow up with ID PRN    I discussed the assessment and treatment plan with the patient. The patient was provided an opportunity to ask questions and all were answered. The patient agreed with the plan and demonstrated an understanding of the instructions.   The patient was advised to call back or seek an in-person evaluation if the symptoms worsen or if the condition fails to improve as anticipated.   I provided 12  minutes of non-face-to-face time during this encounter.  Follow-up: Return if symptoms worsen or fail to improve.   Danelle Earthly, MD [facility] Main number: [phone]

## 2022-03-29 ENCOUNTER — Other Ambulatory Visit: Payer: Self-pay

## 2022-03-29 ENCOUNTER — Ambulatory Visit
Admission: EM | Admit: 2022-03-29 | Discharge: 2022-03-29 | Disposition: A | Discharge status: Home / Self Care | Payer: Commercial Managed Care - PPO

## 2022-03-29 ENCOUNTER — Encounter: Payer: Self-pay | Admitting: Emergency Medicine

## 2022-03-29 DIAGNOSIS — H9202 Otalgia, left ear: Secondary | ICD-10-CM | POA: Diagnosis not present

## 2022-03-29 DIAGNOSIS — B349 Viral infection, unspecified: Secondary | ICD-10-CM | POA: Diagnosis not present

## 2022-03-29 NOTE — ED Provider Notes (Signed)
Renaldo Fiddler    CSN: [id number] Arrival date & time: 03/29/22  1302      History   Chief Complaint Chief Complaint  Patient presents with   Otalgia    HPI Denise Raymond is a 36 y.o. female.  Patient presents with left ear pain and headache x1 day.  Treatment attempted with previously prescribed migraine medications.  No fever, sore throat, cough, shortness of breath, vomiting, diarrhea, or other symptoms.  Her medical history includes headaches, morbid obesity, chronic pain, kidney stones, CKD.  The history is provided by the patient and medical records.    Past Medical History:  Diagnosis Date   Anxiety    Awareness under anesthesia    Chronic pain    CKD (chronic kidney disease)    Depression    Discitis of lumbar region    DJD (degenerative joint disease)    Dysrhythmia    H/O degenerative disc disease    Headache    Kidney stone    Kidney stone    Morbid obesity (HCC)    Palpitations    PVC (premature ventricular contraction)    Scheuermann's kyphosis     Patient Active Problem List   Diagnosis Date Noted   Menorrhagia with irregular cycle 01/08/2022    Past Surgical History:  Procedure Laterality Date   BARIATRIC SURGERY  2014   CHOLECYSTECTOMY  2003   EXPLORATORY LAPAROTOMY     HYSTEROSCOPY WITH D & C N/A 01/08/2022   Procedure: DILATATION AND CURETTAGE /HYSTEROSCOPY;  Surgeon: Conard Novak, MD;  Location: [facility] OR[facility];  Service: Gynecology;  Laterality: N/A;   LAMINECTOMY     lap band sugery     MICRODISCECTOMY LUMBAR  2020   Surgeon: Meyer Cory   POSTERIOR LAMINECTOMY / DECOMPRESSION CERVICAL SPINE     TONSILLECTOMY AND ADENOIDECTOMY  1996    OB History     Gravida  1   Para      Term      Preterm      AB      Living         SAB      IAB      Ectopic      Multiple      Live Births               Home Medications    Prior to Admission medications   Medication Sig Start Date End Date Taking?  Authorizing Provider  buPROPion (WELLBUTRIN XL) 300 MG 24 hr tablet Take 300 mg by mouth daily.    [provider]  ibuprofen (ADVIL) 800 MG tablet Take 800 mg by mouth 2 (two) times daily as needed for mild pain.    [provider]  ondansetron (ZOFRAN ODT) 4 MG disintegrating tablet Take 1 tablet (4 mg total) by mouth every 8 (eight) hours as needed for nausea or vomiting. 04/23/19   Emily Filbert, MD  sertraline (ZOLOFT) 100 MG tablet Take 150 mg by mouth daily. 12/07/17   [provider]  SUMAtriptan (IMITREX) 100 MG tablet Take 100 mg by mouth every 2 (two) hours as needed for migraine. May repeat in 2 hours if headache persists or recurs.    [provider]  tiaGABine (GABITRIL) 4 MG tablet Take 8 mg by mouth 2 (two) times daily.    [provider]  traMADol (ULTRAM) 50 MG tablet Take 1 tablet (50 mg total) by mouth every 8 (eight) hours as needed. Patient not  taking: Reported on 03/29/2022 01/08/22   Conard Novak, MD    Family History History reviewed. No pertinent family history.  Social History Social History   Tobacco Use   Smoking status: Never   Smokeless tobacco: Never  Vaping Use   Vaping Use: Never used  Substance Use Topics   Alcohol use: No   Drug use: No     Allergies   Compazine [prochlorperazine], Reglan [metoclopramide], Bentyl [dicyclomine hcl], Latex, Nsaids, Daptomycin, and Lidoderm [lidocaine]   Review of Systems Review of Systems  Constitutional:  Negative for chills and fever.  HENT:  Positive for ear pain. Negative for sore throat.   Respiratory:  Negative for cough and shortness of breath.   Cardiovascular:  Negative for chest pain and palpitations.  Gastrointestinal:  Negative for diarrhea and vomiting.  Skin:  Negative for color change and rash.  Neurological:  Positive for headaches.  All other systems reviewed and are negative.    Physical Exam Triage Vital Signs ED Triage Vitals  Enc  Vitals Group     BP      Pulse      Resp      Temp      Temp src      SpO2      Weight      Height      Head Circumference      Peak Flow      Pain Score      Pain Loc      Pain Edu?      Excl. in GC?    No data found.  Updated Vital Signs BP (!) 142/84   Pulse 92   Temp 97.8 F (36.6 C)   Resp 18   SpO2 97%   Visual Acuity Right Eye Distance:   Left Eye Distance:   Bilateral Distance:    Right Eye Near:   Left Eye Near:    Bilateral Near:     Physical Exam Vitals and nursing note reviewed.  Constitutional:      General: She is not in acute distress.    Appearance: She is well-developed. She is obese. She is not ill-appearing.  HENT:     Right Ear: Tympanic membrane and ear canal normal.     Left Ear: Tympanic membrane and ear canal normal.     Nose: Nose normal.     Mouth/Throat:     Mouth: Mucous membranes are moist.     Pharynx: Oropharynx is clear.  Cardiovascular:     Rate and Rhythm: Normal rate and regular rhythm.     Heart sounds: Normal heart sounds.  Pulmonary:     Effort: Pulmonary effort is normal. No respiratory distress.     Breath sounds: Normal breath sounds.  Musculoskeletal:     Cervical back: Neck supple.  Skin:    General: Skin is warm and dry.  Neurological:     General: No focal deficit present.     Mental Status: She is alert and oriented to person, place, and time.     Sensory: No sensory deficit.     Motor: No weakness.     Gait: Gait normal.  Psychiatric:        Mood and Affect: Mood normal.        Behavior: Behavior normal.      UC Treatments / Results  Labs (all labs ordered are listed, but only abnormal results are displayed) Labs Reviewed - No data to display  EKG  Radiology No results found.  Procedures Procedures (including critical care time)  Medications Ordered in UC Medications - No data to display  Initial Impression / Assessment and Plan / UC Course  I have reviewed the triage vital signs  and the nursing notes.  Pertinent labs & imaging results that were available during my care of the patient were reviewed by me and considered in my medical decision making (see chart for details).  Left otalgia, viral illness.  Patient is well-appearing and her exam is reassuring.  Discussed Tylenol or ibuprofen as needed.  Education provided on her aches and viral illness.  Instructed patient to follow-up with her PCP if her symptoms are not improving.  She agrees to plan of care.   Final Clinical Impressions(s) / UC Diagnoses   Final diagnoses:  Otalgia of left ear  Viral illness     Discharge Instructions      Take Tylenol or ibuprofen as needed.  Follow up with your primary care provider if your symptoms are not improving.        ED Prescriptions   None    PDMP not reviewed this encounter.   Sharion Balloon, NP 03/29/22 1350

## 2022-03-29 NOTE — Discharge Instructions (Addendum)
Take Tylenol or ibuprofen as needed.  Follow up with your primary care provider if your symptoms are not improving.

## 2022-03-29 NOTE — ED Triage Notes (Signed)
Left ear pain started yesterday. Patient reports slight sore throat and headaches.  Patient took her migraine meds and didn't help

## 2022-09-22 ENCOUNTER — Ambulatory Visit
Admission: RE | Admit: 2022-09-22 | Discharge: 2022-09-22 | Disposition: A | Discharge status: Home / Self Care | Payer: Commercial Managed Care - PPO | Source: Ambulatory Visit | Attending: Family Medicine | Admitting: Family Medicine

## 2022-09-22 VITALS — BP 127/73 | HR 80 | Temp 98.7°F | Resp 18 | Ht 70.0 in | Wt >= 6400 oz

## 2022-09-22 DIAGNOSIS — B349 Viral infection, unspecified: Secondary | ICD-10-CM | POA: Diagnosis not present

## 2022-09-22 NOTE — ED Triage Notes (Signed)
X2 days Pt states that she has a fever, body aches, chills, cough and some nasal congestion.   Pt states that she took a home covid test on 12/24 which was negative. Pt states that she always has a negative covid test and test positive on the send out.

## 2022-09-22 NOTE — Discharge Instructions (Addendum)
Take Tylenol as needed for fever or discomfort.  Rest and keep yourself hydrated.    Follow-up with your primary care provider if your symptoms are not improving.     

## 2022-09-22 NOTE — ED Provider Notes (Signed)
Renaldo Fiddler    CSN: [id number] Arrival date & time: 09/22/22  1304      History   Chief Complaint Chief Complaint  Patient presents with   Fever    Entered by patient    HPI Denise Raymond is a 36 y.o. female.  Patient presents with 2 day history of fever, chills, congestion, cough, body aches.  Negative COVID test at home.  She denies shortness of breath, vomiting, diarrhea, or other symptoms.  No OTC medications taken today.     Patient was seen at [facility] clinic on 08/26/2022; diagnosed with sinusitis and bronchitis; treated with Tessalon Perles, prednisone, Zithromax.  She was seen at [facility] clinic on 07/31/2022 for acute bronchitis; treated with albuterol inhaler, Tessalon Perles, Levaquin, prednisone.  She was seen at [facility] clinic on 07/22/2022; diagnosed with acute bronchitis; treated with codeine cough syrup, prednisone, Omnicef.  She was seen at [facility] clinic on 07/05/2022; diagnosed with sinusitis and UTI; treated with Augmentin, prednisone, hydrocodone cough syrup.  Her medical history includes CKD, chronic pain, kidney stones, morbid obesity.  The history is provided by the patient and medical records.    Past Medical History:  Diagnosis Date   Anxiety    Awareness under anesthesia    Chronic pain    CKD (chronic kidney disease)    Depression    Discitis of lumbar region    DJD (degenerative joint disease)    Dysrhythmia    H/O degenerative disc disease    Headache    Kidney stone    Kidney stone    Morbid obesity (HCC)    Palpitations    PVC (premature ventricular contraction)    Scheuermann's kyphosis     Patient Active Problem List   Diagnosis Date Noted   Menorrhagia with irregular cycle 01/08/2022    Past Surgical History:  Procedure Laterality Date   BARIATRIC SURGERY  2014   CHOLECYSTECTOMY  2003   EXPLORATORY LAPAROTOMY     HYSTEROSCOPY WITH D & C N/A 01/08/2022   Procedure: DILATATION AND CURETTAGE /HYSTEROSCOPY;  Surgeon:  Conard Novak, MD;  Location: [facility] ORS;  Service: Gynecology;  Laterality: N/A;   LAMINECTOMY     lap band sugery     MICRODISCECTOMY LUMBAR  2020   Surgeon: Meyer Cory   POSTERIOR LAMINECTOMY / DECOMPRESSION CERVICAL SPINE     TONSILLECTOMY AND ADENOIDECTOMY  1996    OB History     Gravida  1   Para      Term      Preterm      AB      Living         SAB      IAB      Ectopic      Multiple      Live Births               Home Medications    Prior to Admission medications   Medication Sig Start Date End Date Taking? Authorizing Provider  tiaGABine (GABITRIL) 4 MG tablet Take 8 mg by mouth 2 (two) times daily.   Yes [provider]  buPROPion (WELLBUTRIN XL) 300 MG 24 hr tablet Take 300 mg by mouth daily.    [provider]  ibuprofen (ADVIL) 800 MG tablet Take 800 mg by mouth 2 (two) times daily as needed for mild pain.    [provider]  ondansetron (ZOFRAN ODT) 4 MG disintegrating tablet Take 1 tablet (4 mg total) by  mouth every 8 (eight) hours as needed for nausea or vomiting. 04/23/19   Emily Filbert, MD  sertraline (ZOLOFT) 100 MG tablet Take 150 mg by mouth daily. 12/07/17   [provider]  SUMAtriptan (IMITREX) 100 MG tablet Take 100 mg by mouth every 2 (two) hours as needed for migraine. May repeat in 2 hours if headache persists or recurs.    [provider]  traMADol (ULTRAM) 50 MG tablet Take 1 tablet (50 mg total) by mouth every 8 (eight) hours as needed. Patient not taking: Reported on 03/29/2022 01/08/22   Conard Novak, MD    Family History History reviewed. No pertinent family history.  Social History Social History   Tobacco Use   Smoking status: Never   Smokeless tobacco: Never  Vaping Use   Vaping Use: Never used  Substance Use Topics   Alcohol use: No   Drug use: No     Allergies   Compazine [prochlorperazine], Reglan [metoclopramide], Bentyl [dicyclomine hcl],  Latex, Nsaids, Daptomycin, and Lidoderm [lidocaine]   Review of Systems Review of Systems  Constitutional:  Positive for chills and fever.  HENT:  Positive for congestion. Negative for ear pain and sore throat.   Respiratory:  Positive for cough. Negative for shortness of breath.   Cardiovascular:  Negative for chest pain and palpitations.  Gastrointestinal:  Negative for diarrhea and vomiting.  Skin:  Negative for color change and rash.  All other systems reviewed and are negative.    Physical Exam Triage Vital Signs ED Triage Vitals  Enc Vitals Group     BP      Pulse      Resp      Temp      Temp src      SpO2      Weight      Height      Head Circumference      Peak Flow      Pain Score      Pain Loc      Pain Edu?      Excl. in GC?    No data found.  Updated Vital Signs BP 127/73 (BP Location: Left Arm)   Pulse 80   Temp 98.7 F (37.1 C) (Oral)   Resp 18   Ht 5\' 10"  (1.778 m)   Wt (!) 460 lb (208.7 kg)   LMP 08/25/2022   SpO2 98%   BMI 66.00 kg/m   Visual Acuity Right Eye Distance:   Left Eye Distance:   Bilateral Distance:    Right Eye Near:   Left Eye Near:    Bilateral Near:     Physical Exam Vitals and nursing note reviewed.  Constitutional:      General: She is not in acute distress.    Appearance: She is well-developed. She is obese. She is not ill-appearing.  HENT:     Right Ear: Tympanic membrane normal.     Left Ear: Tympanic membrane normal.     Nose: Nose normal.     Mouth/Throat:     Mouth: Mucous membranes are moist.     Pharynx: Oropharynx is clear.  Cardiovascular:     Rate and Rhythm: Normal rate and regular rhythm.     Heart sounds: Normal heart sounds.  Pulmonary:     Effort: Pulmonary effort is normal. No respiratory distress.     Breath sounds: Normal breath sounds.  Musculoskeletal:     Cervical back: Neck supple.  Skin:  General: Skin is warm and dry.  Neurological:     Mental Status: She is alert.   Psychiatric:        Mood and Affect: Mood normal.        Behavior: Behavior normal.      UC Treatments / Results  Labs (all labs ordered are listed, but only abnormal results are displayed) Labs Reviewed - No data to display  EKG   Radiology No results found.  Procedures Procedures (including critical care time)  Medications Ordered in UC Medications - No data to display  Initial Impression / Assessment and Plan / UC Course  I have reviewed the triage vital signs and the nursing notes.  Pertinent labs & imaging results that were available during my care of the patient were reviewed by me and considered in my medical decision making (see chart for details).    Viral illness.  Patient declines COVID test.  Work note provided per patient request.  Discussed symptomatic treatment including Tylenol, rest, hydration.  Instructed patient to follow up with PCP if symptoms are not improving.  She agrees to plan of care.   Final Clinical Impressions(s) / UC Diagnoses   Final diagnoses:  Viral illness     Discharge Instructions      Take Tylenol as needed for fever or discomfort.  Rest and keep yourself hydrated.    Follow-up with your primary care provider if your symptoms are not improving.         ED Prescriptions   None    PDMP not reviewed this encounter.   Mickie Bail, NP 09/22/22 1400

## 2024-02-22 IMAGING — CT CT ABD-PELV W/ CM
2 of 4 series · 16 of 46 positions shown, 18 images · IV contrast (agent unspecified)
Comparison: 04/23/2019.

CLINICAL DATA: Endometriosis.

EXAM:
CT ABDOMEN AND PELVIS WITH CONTRAST
TECHNIQUE: Multidetector CT imaging of the abdomen and pelvis was performed
using the standard protocol following bolus administration of
intravenous contrast.

[Series 3: abdomen 5.0 · axial · 0.98mm/px · z∈[-199,+256]mm · 13 of 107 slices shown, 15 images]
[im 8/107  soft-tissue]
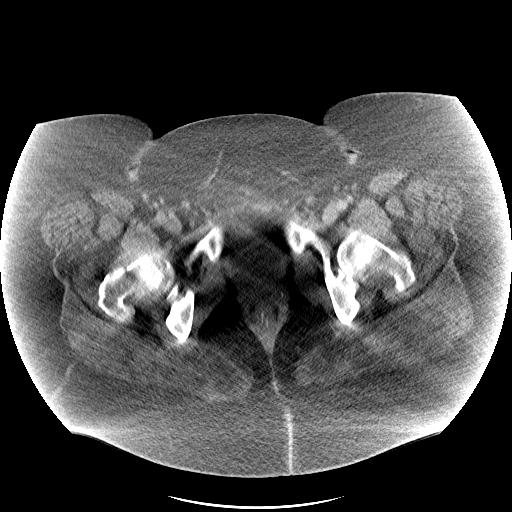
[im 8/107  bone]
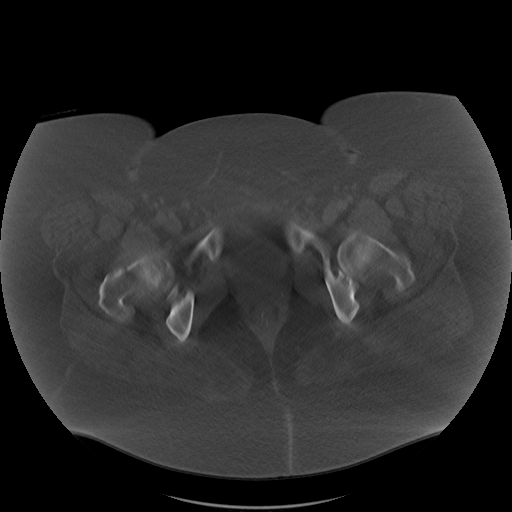
[im 15/107  soft-tissue]
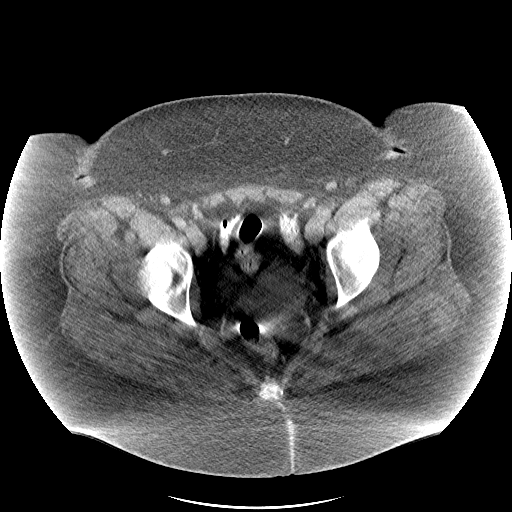
[im 22/107  soft-tissue]
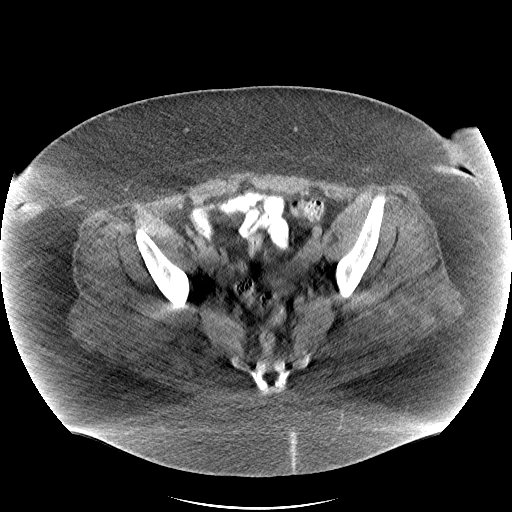
[im 29/107  soft-tissue]
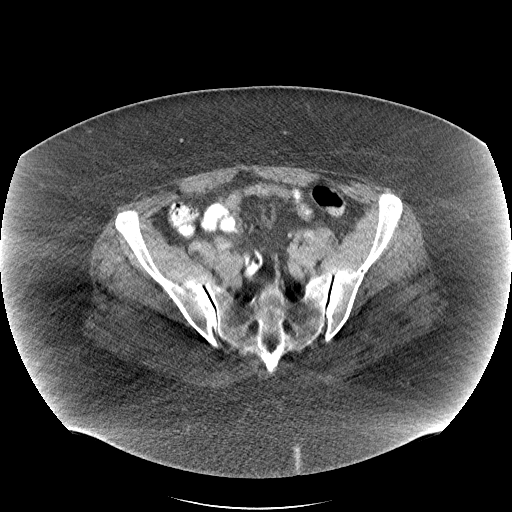
[im 36/107  soft-tissue]
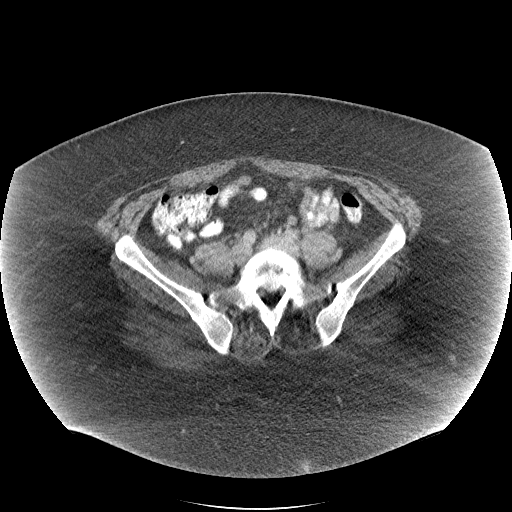
[im 43/107  soft-tissue]
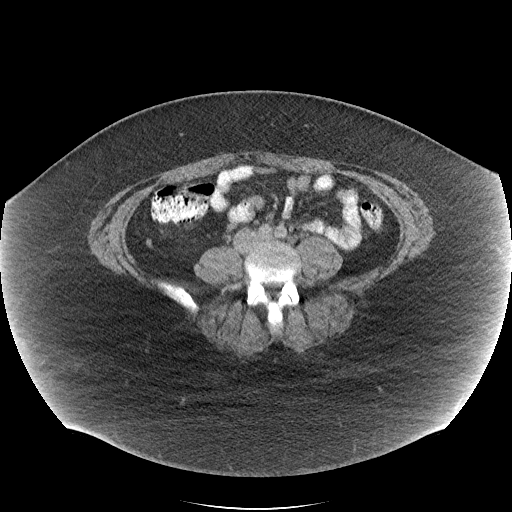
[im 57/107  soft-tissue]
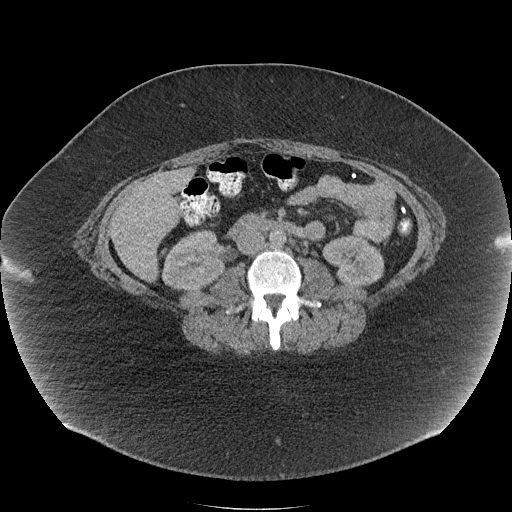
[im 64/107  soft-tissue]
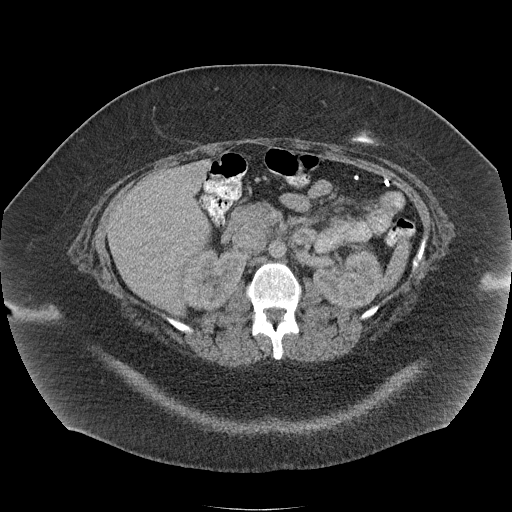
[im 71/107  soft-tissue]
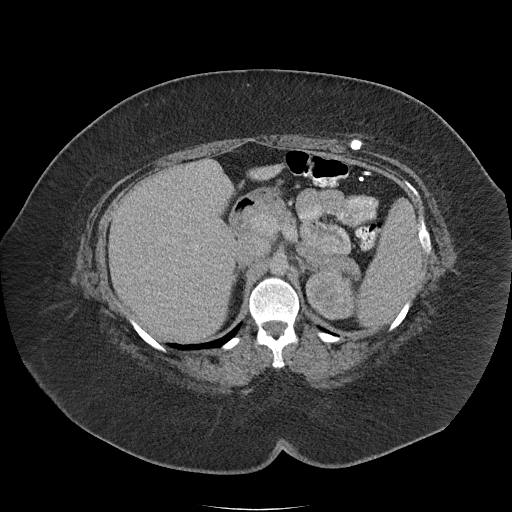
[im 71/107  bone]
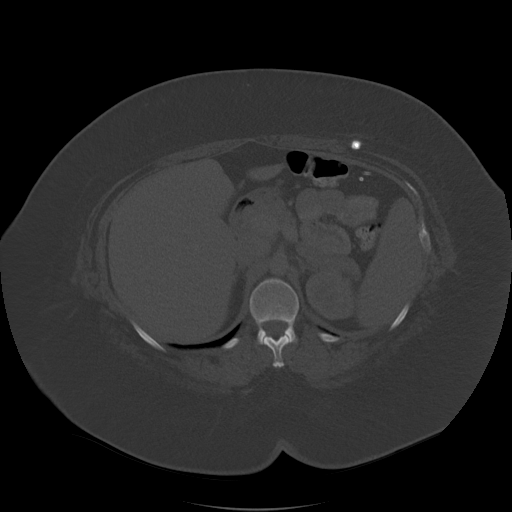
[im 78/107  soft-tissue]
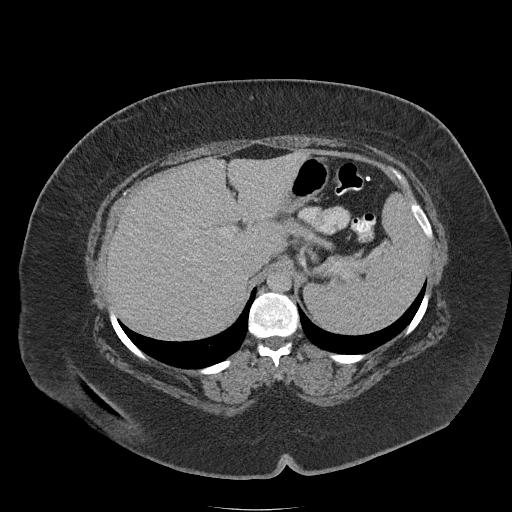
[im 85/107  soft-tissue]
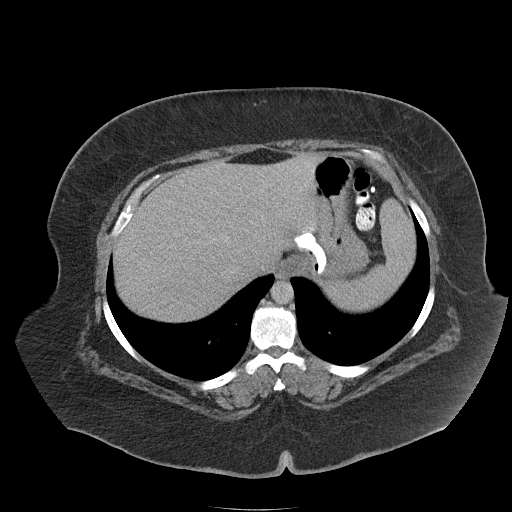
[im 92/107  soft-tissue]
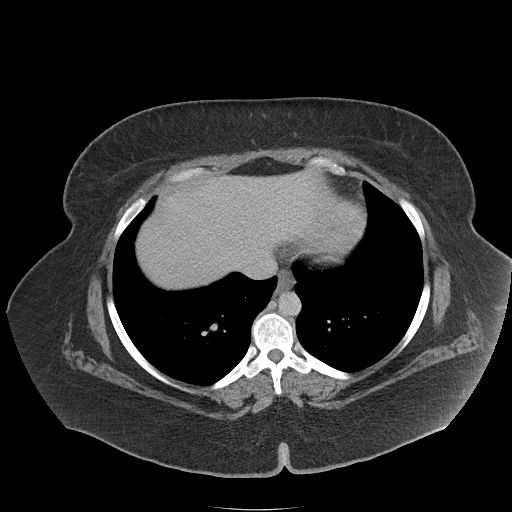
[im 99/107  soft-tissue]
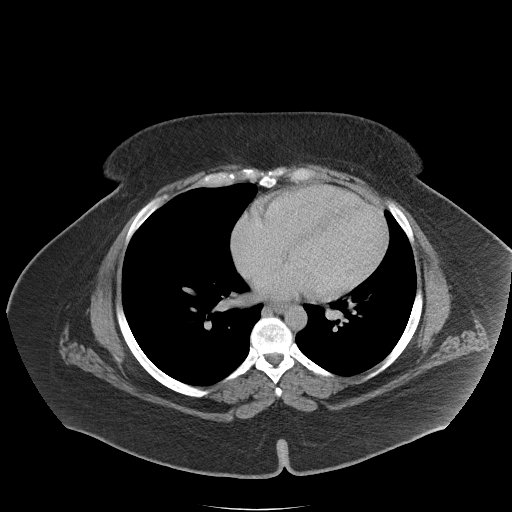

[Series 6: abdomen 3.0 mpr cor · coronal · 1.00mm/px · 3 of 138 slices shown]
[im 46/138  soft-tissue]
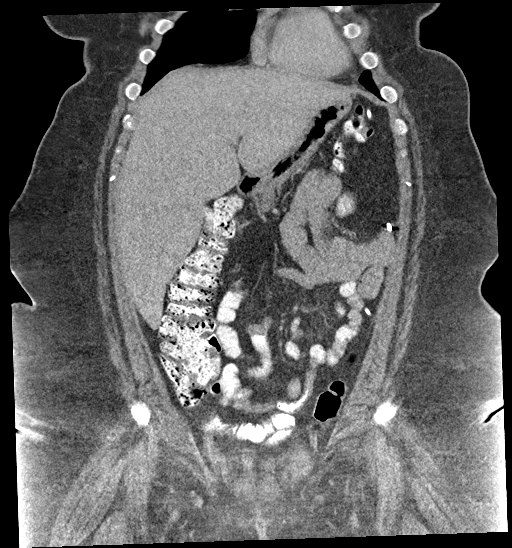
[im 61/138  soft-tissue]
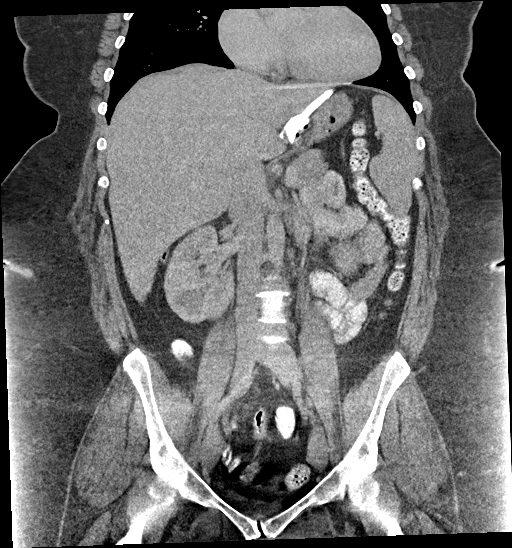
[im 77/138  soft-tissue]
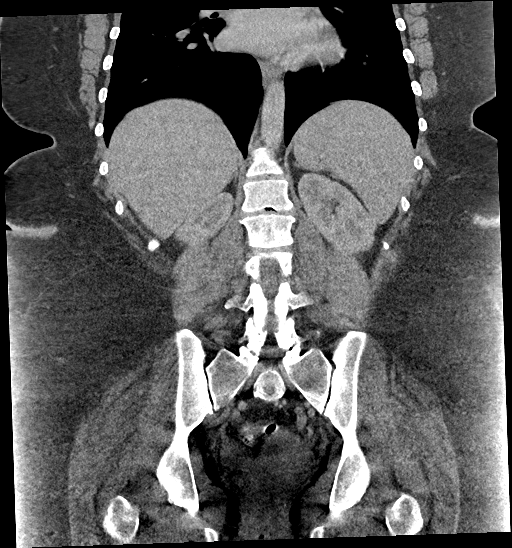

[16 of 46 positions shown; findings below may reference images not displayed]

RADIATION DOSE REDUCTION: This exam was performed according to the
departmental dose-optimization program which includes automated
exposure control, adjustment of the mA and/or kV according to
patient size and/or use of iterative reconstruction technique.

CONTRAST:  100mL OMNIPAQUE IOHEXOL 350 MG/ML SOLN
FINDINGS: Lower chest: No acute abnormality.

Hepatobiliary: No focal liver abnormality is seen. The liver is
enlarged. Status post cholecystectomy. No biliary dilatation.

Pancreas: Unremarkable. No pancreatic ductal dilatation or
surrounding inflammatory changes.

Spleen: Spleen is enlarged at 15 cm in length.

Adrenals/Urinary Tract: The adrenal glands are within normal limits.
The kidneys enhance symmetrically. No renal calculus or
hydronephrosis. The bladder is not visualized on exam due to
patient's body habitus and artifact.

Stomach/Bowel: A gastric lap band is noted. Appendix appears normal.
No evidence of bowel wall thickening, distention, or inflammatory
changes. No free air or pneumatosis. Evaluation of the bowel in the
pelvis is limited due to patient's body habitus and artifact.

Vascular/Lymphatic: No significant vascular findings are present. No
enlarged abdominal or pelvic lymph nodes.

Reproductive: There suboptimal visualization of the uterus and
adnexal regions due to patient's body habitus and artifact.

Other: Small fat containing umbilical hernia.  No ascites.

Musculoskeletal: No acute osseous abnormality.
IMPRESSION: 1. Suboptimal visualization of the pelvic structures and
reproductive organs due to patient's body habitus and artifact.
2. Hepatosplenomegaly.

## 2024-10-31 ENCOUNTER — Inpatient Hospital Stay: Admitting: Oncology

## 2024-10-31 ENCOUNTER — Encounter: Payer: Self-pay | Admitting: Oncology

## 2024-10-31 ENCOUNTER — Inpatient Hospital Stay

## 2024-10-31 VITALS — BP 138/92 | HR 76 | Temp 97.8°F | Resp 18 | Wt >= 6400 oz

## 2024-10-31 DIAGNOSIS — R162 Hepatomegaly with splenomegaly, not elsewhere classified: Secondary | ICD-10-CM | POA: Insufficient documentation

## 2024-10-31 DIAGNOSIS — M2550 Pain in unspecified joint: Secondary | ICD-10-CM | POA: Insufficient documentation

## 2024-10-31 DIAGNOSIS — R7879 Finding of abnormal level of heavy metals in blood: Secondary | ICD-10-CM | POA: Insufficient documentation

## 2024-10-31 LAB — CBC WITH DIFFERENTIAL/PLATELET
Abs Immature Granulocytes: 0.08 10*3/uL — ABNORMAL HIGH (ref 0.00–0.07)
Basophils Absolute: 0 10*3/uL (ref 0.0–0.1)
Basophils Relative: 1 %
Eosinophils Absolute: 0.1 10*3/uL (ref 0.0–0.5)
Eosinophils Relative: 2 %
HCT: 37.7 % (ref 36.0–46.0)
Hemoglobin: 12.3 g/dL (ref 12.0–15.0)
Immature Granulocytes: 1 %
Lymphocytes Relative: 32 %
Lymphs Abs: 2.4 10*3/uL (ref 0.7–4.0)
MCH: 27.6 pg (ref 26.0–34.0)
MCHC: 32.6 g/dL (ref 30.0–36.0)
MCV: 84.5 fL (ref 80.0–100.0)
Monocytes Absolute: 0.4 10*3/uL (ref 0.1–1.0)
Monocytes Relative: 5 %
Neutro Abs: 4.4 10*3/uL (ref 1.7–7.7)
Neutrophils Relative %: 59 %
Platelets: 297 10*3/uL (ref 150–400)
RBC: 4.46 MIL/uL (ref 3.87–5.11)
RDW: 13.2 % (ref 11.5–15.5)
WBC: 7.4 10*3/uL (ref 4.0–10.5)
nRBC: 0 % (ref 0.0–0.2)

## 2024-10-31 LAB — HEPATITIS PANEL, ACUTE
HCV Ab: NONREACTIVE
Hep A IgM: NONREACTIVE
Hep B C IgM: NONREACTIVE
Hepatitis B Surface Ag: NONREACTIVE

## 2024-10-31 LAB — C-REACTIVE PROTEIN: CRP: 1.9 mg/dL — ABNORMAL HIGH

## 2024-10-31 LAB — LACTATE DEHYDROGENASE: LDH: 222 U/L (ref 105–235)

## 2024-10-31 LAB — HIV ANTIBODY (ROUTINE TESTING W REFLEX): HIV Screen 4th Generation wRfx: NONREACTIVE

## 2024-10-31 LAB — SEDIMENTATION RATE: Sed Rate: 19 mm/h (ref 0–20)

## 2024-10-31 NOTE — Assessment & Plan Note (Signed)
 Persistent elevated copper level, ceruloplasmin level, although improved with repeat testing.  GI recommendation was reviewed.  I agree that hide ceruloplasmin level, decreased 24-hour urine copper level, it is less likely that this is Wilson disease. Other etiologies of increased copper level:  # Estrogen exposure, patient was recently stopped on OCPs which may attribute to the initial high level. #Need to rule out any chronic liver disease attributing to increased copper level.  Pending insurance approval for ultrasound complete abdomen for further evaluation. # Other inflammatory etiologies.  See below

## 2024-10-31 NOTE — Assessment & Plan Note (Signed)
 Patient has chronic intermittent multiple joint pain.  Also has history of scalp psoriasis.  Family history of autoimmune disease. Check ANA, CRP, ESR, CCP

## 2024-11-01 LAB — EPSTEIN-BARR VIRUS (EBV) ANTIBODY PROFILE
EBV NA IgG: <18 U/mL (ref 0.0–17.9)
EBV VCA IgG: <18 U/mL (ref 0.0–17.9)
EBV VCA IgM: <36 U/mL (ref 0.0–35.9)

## 2024-11-01 LAB — CMV IGM

## 2024-11-01 LAB — ANTINUCLEAR ANTIBODIES, IFA: ANA Ab, IFA: NEGATIVE

## 2024-11-01 LAB — CYCLIC CITRUL PEPTIDE ANTIBODY, IGG/IGA: CCP Antibodies IgG/IgA: 6 U (ref 0–19)

## 2024-11-02 LAB — COMP PANEL: LEUKEMIA/LYMPHOMA

## 2024-11-03 LAB — COPPER, SERUM: Copper: 183 ug/dL — ABNORMAL HIGH (ref 76–142)

## 2024-11-03 LAB — BCR-ABL1 FISH
Cells Analyzed: 200
Cells Counted: 200
